# Patient Record
Sex: Female | Born: 1985 | Race: White | Hispanic: No | Marital: Single | Smoking: Current every day smoker
Health system: Southern US, Community
[De-identification: ages and names within clinical notes are randomized; demographics above are authoritative.]

## PROBLEM LIST (undated history)

## (undated) DIAGNOSIS — F411 Generalized anxiety disorder: Secondary | ICD-10-CM

## (undated) DIAGNOSIS — F3289 Other specified depressive episodes: Secondary | ICD-10-CM

## (undated) DIAGNOSIS — A630 Anogenital (venereal) warts: Secondary | ICD-10-CM

## (undated) DIAGNOSIS — K219 Gastro-esophageal reflux disease without esophagitis: Secondary | ICD-10-CM

## (undated) DIAGNOSIS — F329 Major depressive disorder, single episode, unspecified: Secondary | ICD-10-CM

## (undated) DIAGNOSIS — H15001 Unspecified scleritis, right eye: Secondary | ICD-10-CM

## (undated) HISTORY — DX: Major depressive disorder, single episode, unspecified: F32.9

## (undated) HISTORY — DX: Other specified depressive episodes: F32.89

## (undated) HISTORY — DX: Generalized anxiety disorder: F41.1

## (undated) HISTORY — PX: NO PAST SURGERIES: SHX2092

## (undated) HISTORY — DX: Anogenital (venereal) warts: A63.0

## (undated) HISTORY — DX: Unspecified scleritis, right eye: H15.001

## (undated) HISTORY — DX: Gastro-esophageal reflux disease without esophagitis: K21.9

---
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
## 2003-05-13 ENCOUNTER — Other Ambulatory Visit: Admission: RE | Admit: 2003-05-13 | Discharge: 2003-05-13 | Payer: Self-pay | Admitting: Internal Medicine

## 2003-12-24 ENCOUNTER — Other Ambulatory Visit: Admission: RE | Admit: 2003-12-24 | Discharge: 2003-12-24 | Payer: Self-pay | Admitting: Obstetrics and Gynecology

## 2004-05-02 ENCOUNTER — Other Ambulatory Visit: Admission: RE | Admit: 2004-05-02 | Discharge: 2004-05-02 | Payer: Self-pay | Admitting: Obstetrics and Gynecology

## 2004-06-09 ENCOUNTER — Encounter: Admission: RE | Admit: 2004-06-09 | Discharge: 2004-09-07 | Payer: Self-pay | Admitting: Internal Medicine

## 2004-08-02 ENCOUNTER — Ambulatory Visit: Payer: Self-pay | Admitting: Licensed Clinical Social Worker

## 2004-08-12 ENCOUNTER — Ambulatory Visit: Payer: Self-pay | Admitting: Internal Medicine

## 2004-08-16 ENCOUNTER — Ambulatory Visit: Payer: Self-pay | Admitting: Licensed Clinical Social Worker

## 2004-08-18 ENCOUNTER — Ambulatory Visit: Payer: Self-pay | Admitting: Licensed Clinical Social Worker

## 2004-09-02 ENCOUNTER — Ambulatory Visit: Payer: Self-pay | Admitting: Internal Medicine

## 2004-09-08 ENCOUNTER — Ambulatory Visit: Payer: Self-pay | Admitting: Internal Medicine

## 2004-11-04 ENCOUNTER — Other Ambulatory Visit: Admission: RE | Admit: 2004-11-04 | Discharge: 2004-11-04 | Payer: Self-pay | Admitting: Obstetrics and Gynecology

## 2005-01-25 ENCOUNTER — Ambulatory Visit: Payer: Self-pay | Admitting: Internal Medicine

## 2005-02-09 ENCOUNTER — Ambulatory Visit: Payer: Self-pay | Admitting: Internal Medicine

## 2005-02-10 ENCOUNTER — Encounter: Payer: Self-pay | Admitting: Internal Medicine

## 2005-02-10 LAB — CONVERTED CEMR LAB
ALT: 25 units/L
Albumin: 3.8 g/dL
Alkaline Phosphatase: 60 units/L
BUN: 12 mg/dL
Basophils Relative: 0 %
CO2: 25 meq/L
Creatinine, Ser: 0.9 mg/dL
Eosinophils Relative: 0.7 %
HCT: 38.1 %
HDL: 54 mg/dL
Monocytes Relative: 6.8 %
RBC: 4.11 M/uL
Total Bilirubin: 0.7 mg/dL
Triglyceride fasting, serum: 66 mg/dL
WBC: 9.4 10*3/uL

## 2005-02-16 ENCOUNTER — Ambulatory Visit: Payer: Self-pay | Admitting: Internal Medicine

## 2005-02-17 ENCOUNTER — Emergency Department (HOSPITAL_COMMUNITY): Admission: EM | Admit: 2005-02-17 | Discharge: 2005-02-17 | Payer: Self-pay | Admitting: Emergency Medicine

## 2005-02-20 ENCOUNTER — Ambulatory Visit: Payer: Self-pay | Admitting: Cardiology

## 2005-03-06 ENCOUNTER — Ambulatory Visit: Payer: Self-pay | Admitting: Internal Medicine

## 2005-03-21 ENCOUNTER — Ambulatory Visit: Payer: Self-pay | Admitting: Internal Medicine

## 2005-03-28 ENCOUNTER — Ambulatory Visit: Payer: Self-pay | Admitting: Internal Medicine

## 2005-04-28 ENCOUNTER — Other Ambulatory Visit: Admission: RE | Admit: 2005-04-28 | Discharge: 2005-04-28 | Payer: Self-pay | Admitting: Obstetrics and Gynecology

## 2006-01-29 ENCOUNTER — Other Ambulatory Visit: Admission: RE | Admit: 2006-01-29 | Discharge: 2006-01-29 | Payer: Self-pay | Admitting: Obstetrics & Gynecology

## 2006-03-20 ENCOUNTER — Ambulatory Visit: Payer: Self-pay | Admitting: Internal Medicine

## 2006-06-01 ENCOUNTER — Ambulatory Visit: Payer: Self-pay | Admitting: Internal Medicine

## 2006-11-19 ENCOUNTER — Other Ambulatory Visit: Admission: RE | Admit: 2006-11-19 | Discharge: 2006-11-19 | Payer: Self-pay | Admitting: Obstetrics & Gynecology

## 2007-06-28 ENCOUNTER — Other Ambulatory Visit: Admission: RE | Admit: 2007-06-28 | Discharge: 2007-06-28 | Payer: Self-pay | Admitting: Obstetrics & Gynecology

## 2007-08-06 ENCOUNTER — Other Ambulatory Visit: Admission: RE | Admit: 2007-08-06 | Discharge: 2007-08-06 | Payer: Self-pay | Admitting: Obstetrics and Gynecology

## 2007-11-22 ENCOUNTER — Other Ambulatory Visit: Admission: RE | Admit: 2007-11-22 | Discharge: 2007-11-22 | Payer: Self-pay | Admitting: Obstetrics and Gynecology

## 2009-11-02 LAB — CONVERTED CEMR LAB

## 2010-07-07 ENCOUNTER — Encounter: Payer: Self-pay | Admitting: Internal Medicine

## 2010-07-07 DIAGNOSIS — Z8679 Personal history of other diseases of the circulatory system: Secondary | ICD-10-CM | POA: Insufficient documentation

## 2010-07-07 DIAGNOSIS — L708 Other acne: Secondary | ICD-10-CM

## 2010-07-07 DIAGNOSIS — K219 Gastro-esophageal reflux disease without esophagitis: Secondary | ICD-10-CM

## 2010-07-07 DIAGNOSIS — Z9189 Other specified personal risk factors, not elsewhere classified: Secondary | ICD-10-CM | POA: Insufficient documentation

## 2010-07-07 DIAGNOSIS — F329 Major depressive disorder, single episode, unspecified: Secondary | ICD-10-CM

## 2010-07-07 DIAGNOSIS — F17200 Nicotine dependence, unspecified, uncomplicated: Secondary | ICD-10-CM | POA: Insufficient documentation

## 2010-07-07 DIAGNOSIS — F411 Generalized anxiety disorder: Secondary | ICD-10-CM | POA: Insufficient documentation

## 2010-07-07 DIAGNOSIS — Z8619 Personal history of other infectious and parasitic diseases: Secondary | ICD-10-CM | POA: Insufficient documentation

## 2010-07-08 ENCOUNTER — Ambulatory Visit: Payer: Self-pay | Admitting: Internal Medicine

## 2010-07-08 DIAGNOSIS — A630 Anogenital (venereal) warts: Secondary | ICD-10-CM | POA: Insufficient documentation

## 2010-07-08 DIAGNOSIS — R220 Localized swelling, mass and lump, head: Secondary | ICD-10-CM | POA: Insufficient documentation

## 2010-07-08 DIAGNOSIS — Z87891 Personal history of nicotine dependence: Secondary | ICD-10-CM

## 2010-07-08 DIAGNOSIS — R221 Localized swelling, mass and lump, neck: Secondary | ICD-10-CM | POA: Insufficient documentation

## 2010-07-08 DIAGNOSIS — Z87448 Personal history of other diseases of urinary system: Secondary | ICD-10-CM | POA: Insufficient documentation

## 2010-07-08 LAB — CONVERTED CEMR LAB
ALT: 22 units/L (ref 0–35)
AST: 22 units/L (ref 0–37)
Albumin: 4.1 g/dL (ref 3.5–5.2)
BUN: 12 mg/dL (ref 6–23)
Basophils Relative: 0.7 % (ref 0.0–3.0)
Bilirubin Urine: NEGATIVE
CO2: 27 meq/L (ref 19–32)
Chloride: 106 meq/L (ref 96–112)
Cholesterol: 161 mg/dL (ref 0–200)
Creatinine, Ser: 0.8 mg/dL (ref 0.4–1.2)
Eosinophils Absolute: 0.5 10*3/uL (ref 0.0–0.7)
Glucose, Bld: 99 mg/dL (ref 70–99)
HCT: 39.2 % (ref 36.0–46.0)
Hemoglobin, Urine: NEGATIVE
Hemoglobin: 13.5 g/dL (ref 12.0–15.0)
Ketones, ur: NEGATIVE mg/dL
LDL Cholesterol: 84 mg/dL (ref 0–99)
Leukocytes, UA: NEGATIVE
Lymphocytes Relative: 39.2 % (ref 12.0–46.0)
Lymphs Abs: 2.3 10*3/uL (ref 0.7–4.0)
MCHC: 34.4 g/dL (ref 30.0–36.0)
Neutro Abs: 2.6 10*3/uL (ref 1.4–7.7)
Nitrite: NEGATIVE
Potassium: 4.4 meq/L (ref 3.5–5.1)
RBC: 4 M/uL (ref 3.87–5.11)
RDW: 12.1 % (ref 11.5–14.6)
TSH: 0.85 microintl units/mL (ref 0.35–5.50)
Total Bilirubin: 0.5 mg/dL (ref 0.3–1.2)
Total Protein: 7.2 g/dL (ref 6.0–8.3)
Triglycerides: 85 mg/dL (ref 0.0–149.0)
Urobilinogen, UA: 0.2 (ref 0.0–1.0)
pH: 6 (ref 5.0–8.0)

## 2010-07-18 ENCOUNTER — Encounter: Admission: RE | Admit: 2010-07-18 | Discharge: 2010-07-18 | Payer: Self-pay | Admitting: Internal Medicine

## 2010-11-01 NOTE — Assessment & Plan Note (Signed)
Summary: NEW [employer] PT--#--FORMER [facility] PT-PKG--STC   Vital Signs:  Patient profile:   25 year old female Height:      62.5 inches (158.75 cm) Weight:      102.8 pounds (46.73 kg) BMI:     18.57 O2 Sat:      95 % on Room air Temp:     97.8 degrees F (36.56 degrees C) oral Pulse rate:   68 / minute BP sitting:   104 / 62  (left arm) Cuff size:   regular  Vitals Entered By: Orlan Leavens RMA (July 08, 2010 8:11 AM)  O2 Flow:  Room air CC: New patient Is Patient Diabetic? No Pain Assessment Patient in pain? no        Primary Care Provider:  Newt Lukes MD  CC:  New patient.  History of Present Illness: new pt to me and our practice, here to est care patient is here today for annual physical. Patient feels well today.  concerned about soreness in anterior neck onset 2 months ago - feels full and swollen in "thyroid" area assoc with mild dysphagia - solids and liquids but no regurg or choking, no pain no weight change, no fever - no swelling axillary or groin region-  Preventive Screening-Counseling & Management  Alcohol-Tobacco     Alcohol drinks/day: 0     Alcohol Counseling: not indicated; patient does not drink     Smoking Status: quit     Tobacco Counseling: not to resume use of tobacco products  Caffeine-Diet-Exercise     Exercise Counseling: not indicated; exercise is adequate     Depression Counseling: not indicated; screening negative for depression  Safety-Violence-Falls     Seat Belt Counseling: not indicated; patient wears seat belts     Helmet Counseling: not indicated; patient wears helmet when riding bicycle/motocycle     Firearm Counseling: not applicable     Violence Counseling: not indicated; no violence risk noted     Fall Risk Counseling: not indicated; no significant falls noted  Clinical Review Panels:  Prevention   Last Pap Smear:  Interpretation Result:Negative for intraepithelial Lesion or Malignancy.     (11/02/2009)  Immunizations   Last Tetanus Booster:  Historical (10/03/2007)  Lipid Management   Cholesterol:  166 (02/10/2005)   LDL (bad choesterol):  99 (02/10/2005)   HDL (good cholesterol):  54 (02/10/2005)   Triglycerides:  66 (02/10/2005)  CBC   WBC:  9.4 (02/10/2005)   RBC:  4.11 (02/10/2005)   Hgb:  13.5 (02/10/2005)   Hct:  38.1 (02/10/2005)   Platelets:  211 (02/10/2005)   MCV  92.7 (02/10/2005)   RDW  11.5 (02/10/2005)   PMN:  68.9 (02/10/2005)   Monos:  6.8 (02/10/2005)   Eosinophils:  0.7 (02/10/2005)   Basophil:  0 (02/10/2005)  Complete Metabolic Panel   Glucose:  89 (02/10/2005)   Sodium:  137 (02/10/2005)   Potassium:  3.7 (02/10/2005)   Chloride:  100 (02/10/2005)   CO2:  25 (02/10/2005)   BUN:  12 (02/10/2005)   Creatinine:  0.9 (02/10/2005)   Albumin:  3.8 (02/10/2005)   Total Protein:  7.1 (02/10/2005)   Calcium:  9.4 (02/10/2005)   Total Bili:  0.7 (02/10/2005)   Alk Phos:  60 (02/10/2005)   SGPT (ALT):  25 (02/10/2005)   SGOT (AST):  28 (02/10/2005)   Current Medications (verified): 1)  Junel Fe 1/20 1-20 Mg-Mcg Tabs (Norethin Ace-Eth Estrad-Fe) .... 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 I have reviewed all other systems and they were negative.    Physical Exam  General:  thin, alert, well-developed, well-nourished, and cooperative to examination.    Head:  Normocephalic and atraumatic without obvious abnormalities. No apparent alopecia or balding. Eyes:  vision grossly intact; pupils equal, round and reactive to light.  conjunctiva and lids normal.    Ears:  normal pinnae bilaterally, without erythema, swelling, or tenderness to palpation. TMs clear, without effusion, or cerumen impaction. Hearing grossly normal bilaterally  Mouth:  teeth and gums in good repair; mucous membranes moist, without lesions or ulcers. oropharynx clear without exudate, no erythema.  Neck:  supple, full ROM, no masses, no thyromegaly; no thyroid nodules or tenderness. no JVD or carotid bruits.  min fullnesss base of right neck (normal LN suspected) Lungs:  normal respiratory effort, no intercostal retractions or use of accessory muscles; normal breath sounds bilaterally - no crackles and no wheezes.    Heart:  normal rate, regular rhythm, no murmur, and no rub. BLE without edema. normal DP pulses and normal cap refill in all 4 extremities    Abdomen:  soft, non-tender, normal bowel sounds, no distention; no masses and no appreciable hepatomegaly or splenomegaly.   Genitalia:  defer gyn Msk:  No deformity or scoliosis noted of thoracic or lumbar spine.   Neurologic:  alert & oriented X3 and cranial nerves II-XII symetrically intact.  strength normal in all extremities, sensation intact to light touch, and gait normal. speech fluent without dysarthria or aphasia; follows commands with good comprehension.  Skin:  no rashes, vesicles, ulcers, or erythema. No nodules or irregularity to palpation.  Psych:  Oriented X3, memory intact for recent and remote, normally interactive, good eye contact, not anxious appearing, not depressed appearing, and not agitated.      Impression & Recommendations:  Problem # 1:  PREVENTIVE HEALTH CARE (ICD-V70.0) Patient has been  counseled on age-appropriate routine health concerns for screening and prevention. These are reviewed and up-to-date. Immunizations are up-to-date or declined. Labs ordered and will be reviewed.  Orders: TLB-Lipid Panel (80061-LIPID) TLB-BMP (Basic Metabolic Panel-BMET) (80048-METABOL) TLB-CBC Platelet - w/Differential (85025-CBCD) TLB-Hepatic/Liver Function Pnl (80076-HEPATIC) TLB-TSH (Thyroid Stimulating Hormone) (84443-TSH) TLB-Udip w/ Micro (81001-URINE)  Problem # 2:  SWELLING MASS OR LUMP IN HEAD AND NECK (ICD-784.2) suspect benign but pt concerned due to mom hx thyroid cysts - as assoc with symptoms of dysphagia, will check neck US r/o mass cont OTC H2B or PPI as needed  Orders: Radiology Referral (Radiology)  Problem # 3:  DYSPHAGIA UNSPECIFIED (ZOX-096.04)  Orders: Radiology Referral (Radiology)  Complete Medication List: 1)  Junel Fe 1/20 1-20 Mg-mcg Tabs (Norethin ace-eth estrad-fe) .... Take 1 by mouth once daily  Patient Instructions: 1)  it was good to see you today. 2)  exama dn vitals look good today - 3)  test(s) ordered today - your  results will be posted on the phone tree for review in 48-72 hours from the time of test completion; call [phone] and enter your 9 digit MRN (listed above on this page, just below your name); if any changes need to be made or there are abnormal results, you will be contacted directly. 4)  we'll make referral for neck ultrasound. Our office will contact you regarding this appointment once made. Then you will be contacted about results once reviewed - further eval or treatment will be determined at that time as needed  5)  Please schedule a follow-up appointment as needed.   Immunization History:  Tetanus/Td Immunization History:    Tetanus/Td:  historical (10/03/2007)    Pap Smear  Procedure date:  11/02/2009  Findings:      Interpretation Result:Negative for intraepithelial Lesion or Malignancy.

## 2011-02-17 IMAGING — US US SOFT TISSUE HEAD/NECK
1 series · 14 of 25 positions shown · non-contrast
Comparison: None.

CLINICAL DATA: The patient notes a slight swelling within the
submental region.  Also, she states she has a history of thyroid
cysts in her mother.

ULTRASOUND OF HEAD/NECK SOFT TISSUES
TECHNIQUE: Ultrasound examination of the head and neck soft
tissues was performed in the area of clinical concern.

[Series 1: us soft tissue head/neck · 0.08mm/px · 14 of 53 slices shown]
[im 1/53]
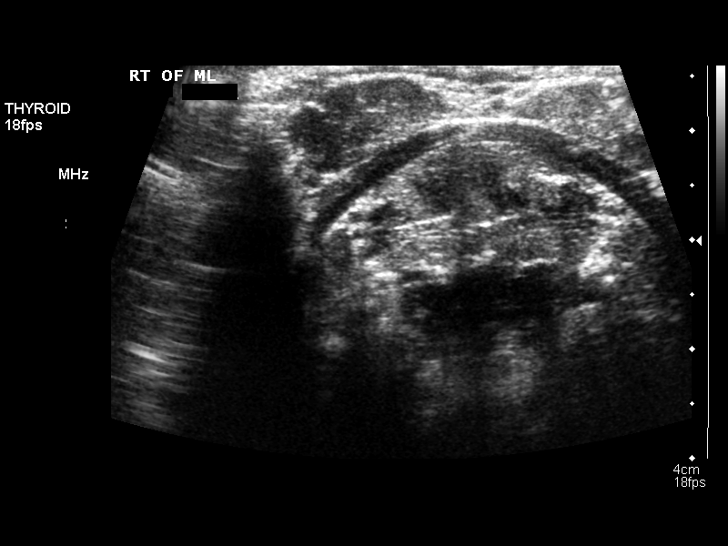
[im 5/53]
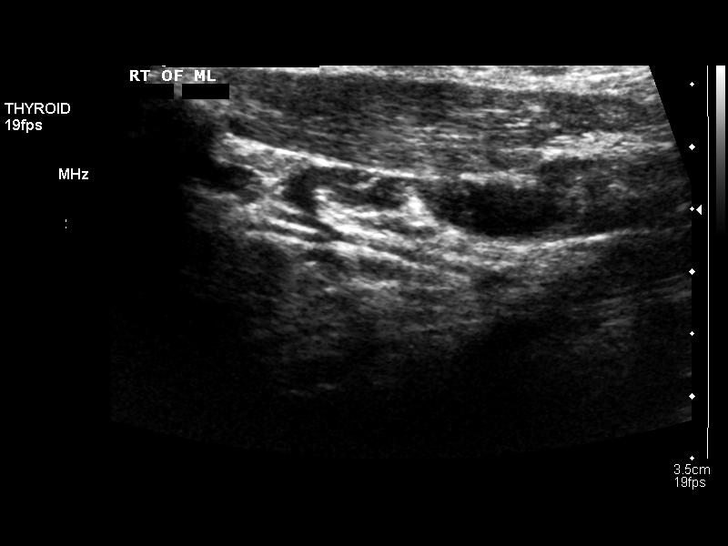
[im 9/53]
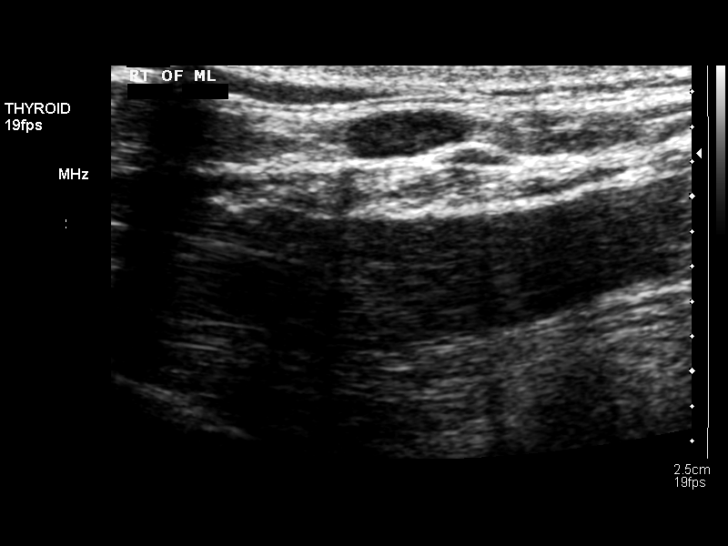
[im 14/53]
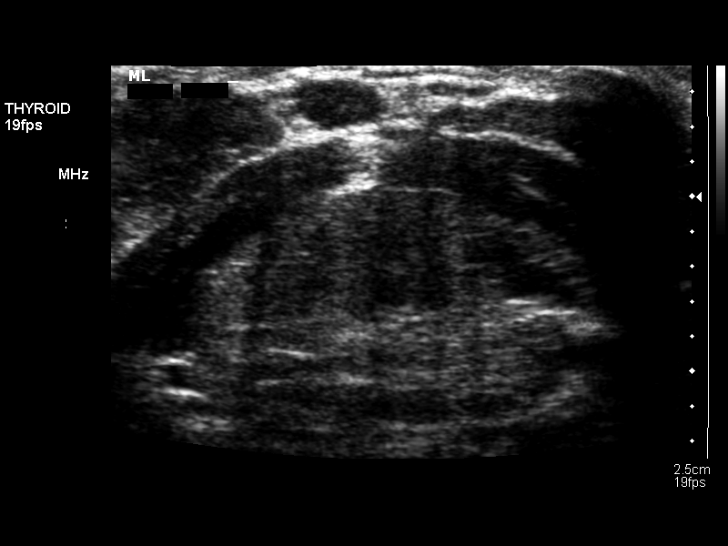
[im 18/53]
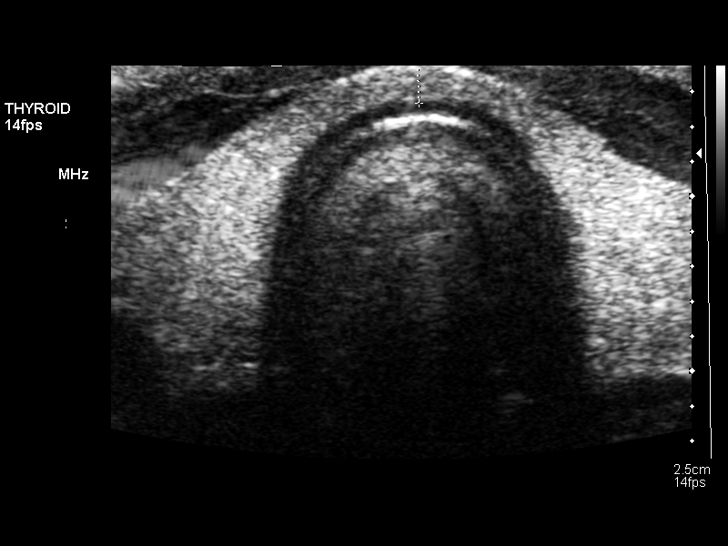
[im 20/53]
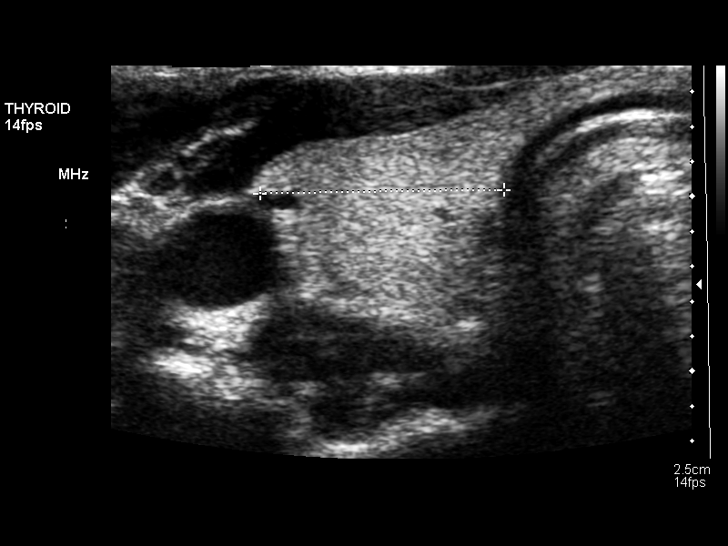
[im 24/53]
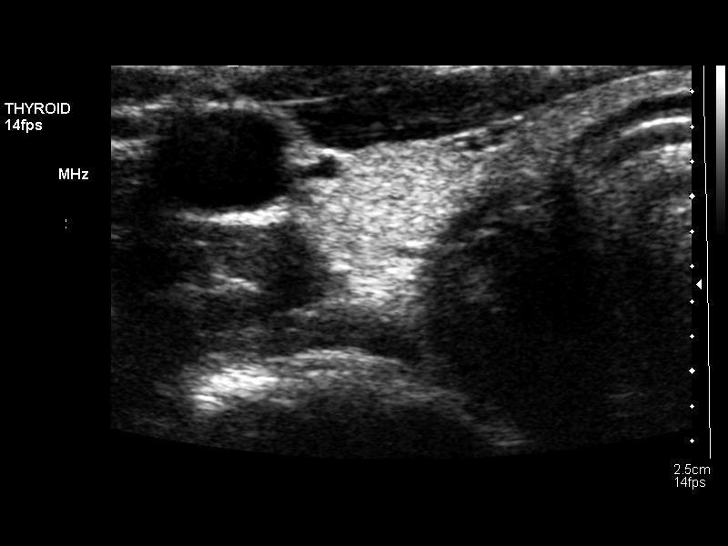
[im 29/53]
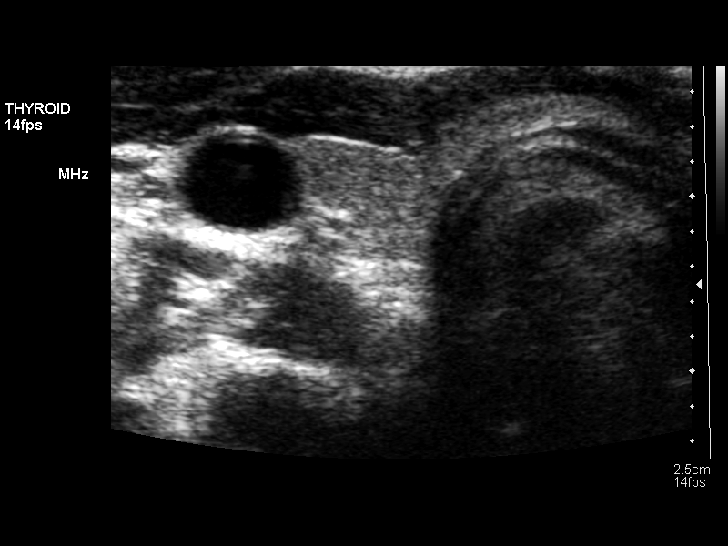
[im 33/53]
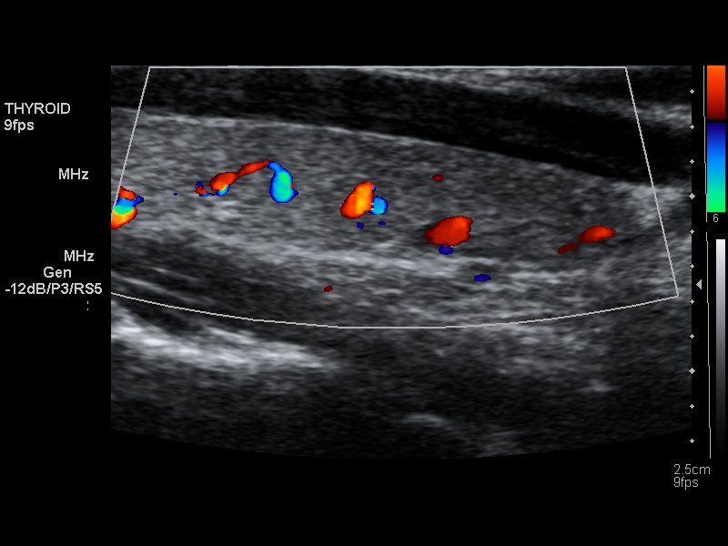
[im 35/53]
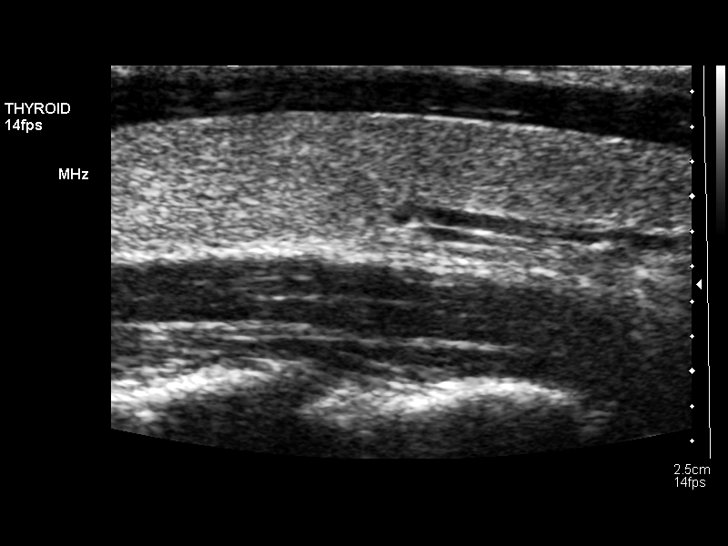
[im 40/53]
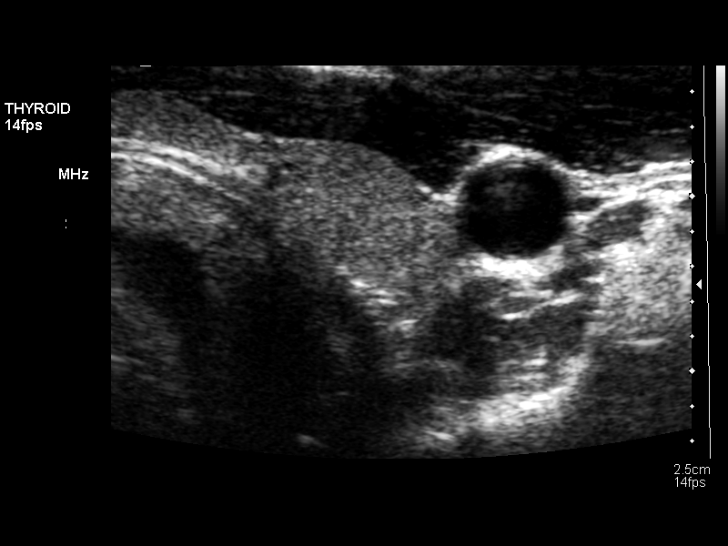
[im 44/53]
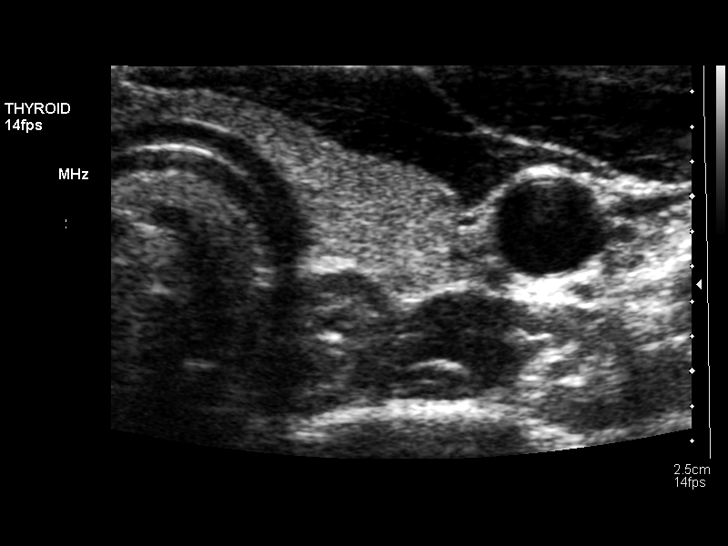
[im 48/53]
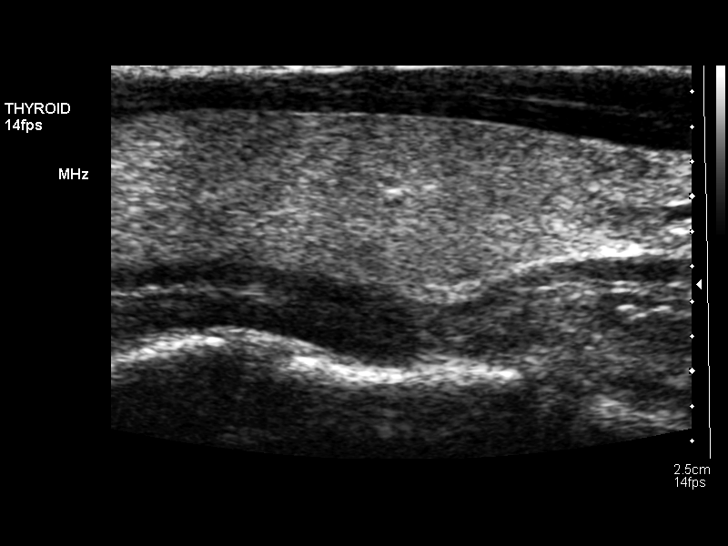
[im 53/53]
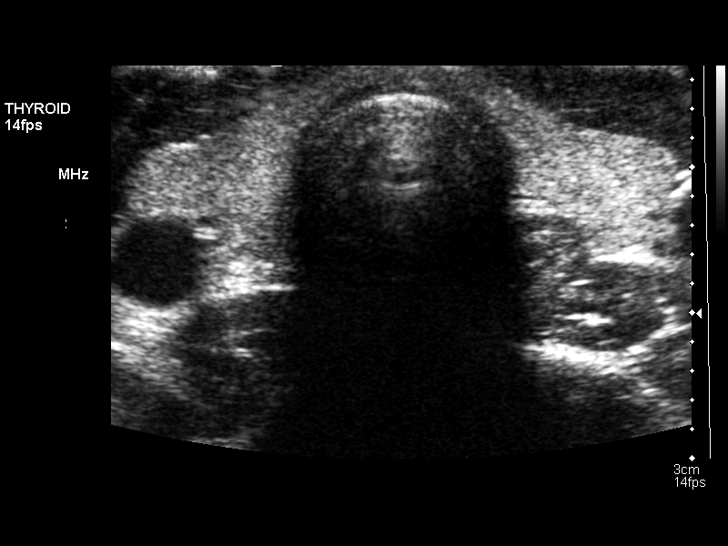

[14 of 25 positions shown; findings below may reference images not displayed]

FINDINGS: There is a 7 mm normal-appearing submental lymph node
present in the area the patient has slight swelling.  The there is
no evidence for a mass in this region.

The thyroid gland is normal in size and contour with the right lobe
of the thyroid measuring 5.0 x 1.0 x 1.4 cm in size.  The left lobe
measures 4.4 x 1.2 x 1.4 cm in size.  There are no focal
abnormalities.  The thyroid echotexture is homogeneous.
IMPRESSION: Small (7 mm) normal appearing lymph node within the submental
region.  Normal appearing thyroid gland.

## 2011-03-21 ENCOUNTER — Inpatient Hospital Stay (INDEPENDENT_AMBULATORY_CARE_PROVIDER_SITE_OTHER)
Admission: RE | Admit: 2011-03-21 | Discharge: 2011-03-21 | Disposition: A | Discharge status: Home / Self Care | Payer: Self-pay | Source: Ambulatory Visit | Attending: Family Medicine | Admitting: Family Medicine

## 2011-03-21 DIAGNOSIS — H6120 Impacted cerumen, unspecified ear: Secondary | ICD-10-CM

## 2011-06-06 ENCOUNTER — Telehealth: Payer: Self-pay | Admitting: *Deleted

## 2011-06-06 DIAGNOSIS — Z0000 Encounter for general adult medical examination without abnormal findings: Secondary | ICD-10-CM

## 2011-06-06 NOTE — Telephone Encounter (Signed)
Received staff msg pt coming in for CPX needs labs entered in EPIC. Entered in EPIC...06/06/11@12 :13pm/LMB

## 2011-06-08 ENCOUNTER — Other Ambulatory Visit (INDEPENDENT_AMBULATORY_CARE_PROVIDER_SITE_OTHER): Payer: 59

## 2011-06-08 ENCOUNTER — Ambulatory Visit (INDEPENDENT_AMBULATORY_CARE_PROVIDER_SITE_OTHER): Payer: 59 | Admitting: Internal Medicine

## 2011-06-08 ENCOUNTER — Encounter: Payer: Self-pay | Admitting: Internal Medicine

## 2011-06-08 VITALS — BP 110/70 | HR 89 | Temp 98.2°F | Ht 62.0 in | Wt 97.0 lb

## 2011-06-08 DIAGNOSIS — Z0000 Encounter for general adult medical examination without abnormal findings: Secondary | ICD-10-CM

## 2011-06-08 DIAGNOSIS — G4700 Insomnia, unspecified: Secondary | ICD-10-CM | POA: Insufficient documentation

## 2011-06-08 LAB — CBC WITH DIFFERENTIAL/PLATELET
Basophils Absolute: 0 10*3/uL (ref 0.0–0.1)
HCT: 38.5 % (ref 36.0–46.0)
Hemoglobin: 13.1 g/dL (ref 12.0–15.0)
Lymphs Abs: 1.9 10*3/uL (ref 0.7–4.0)
MCHC: 34 g/dL (ref 30.0–36.0)
MCV: 98.2 fl (ref 78.0–100.0)
Monocytes Absolute: 0.3 10*3/uL (ref 0.1–1.0)
Neutro Abs: 2.3 10*3/uL (ref 1.4–7.7)
Platelets: 195 10*3/uL (ref 150.0–400.0)
RDW: 12.4 % (ref 11.5–14.6)

## 2011-06-08 LAB — LIPID PANEL
LDL Cholesterol: 82 mg/dL (ref 0–99)
Total CHOL/HDL Ratio: 3
Triglycerides: 80 mg/dL (ref 0.0–149.0)
VLDL: 16 mg/dL (ref 0.0–40.0)

## 2011-06-08 LAB — BASIC METABOLIC PANEL
BUN: 14 mg/dL (ref 6–23)
CO2: 24 mEq/L (ref 19–32)
Chloride: 101 mEq/L (ref 96–112)
Glucose, Bld: 132 mg/dL — ABNORMAL HIGH (ref 70–99)
Potassium: 3.9 mEq/L (ref 3.5–5.1)

## 2011-06-08 LAB — URINALYSIS, ROUTINE W REFLEX MICROSCOPIC
Bilirubin Urine: NEGATIVE
Hgb urine dipstick: NEGATIVE
Ketones, ur: NEGATIVE
Urine Glucose: NEGATIVE
Urobilinogen, UA: 0.2 (ref 0.0–1.0)

## 2011-06-08 LAB — HEPATIC FUNCTION PANEL
Albumin: 4.2 g/dL (ref 3.5–5.2)
Total Bilirubin: 0.6 mg/dL (ref 0.3–1.2)

## 2011-06-08 MED ORDER — ZOLPIDEM TARTRATE 5 MG PO TABS: 5.0000 mg | ORAL_TABLET | Freq: Every evening | ORAL | Status: DC | PRN

## 2011-06-08 NOTE — Patient Instructions (Signed)
It was good to see you today. Exam and vitals look good and your Health Maintenance reviewed - all up to date! Form signed as requested Test(s) done for physical today. Your results will be called to you after review (48-72hours after test completion). If any changes need to be made, you will be notified at that time. Try generic Ambien for sleep as needed - Your prescription(s) have been submitted to your pharmacy. Please take as directed and contact our office if you believe you are having problem(s) with the medication(s). Please schedule followup annually for physical and labs, call sooner if problems.

## 2011-06-08 NOTE — Progress Notes (Signed)
Subjective:    Patient ID: Kaitlyn Sutton, female    DOB: 1986/09/02, 25 y.o.   MRN: [id number]  HPI patient is here today for annual physical. Patient feels well today  occassional insomnia - uses benadryl prn with some relief - denies depression or anxiety symptoms   Past Medical History  Diagnosis Date  . ANXIETY   . DEPRESSION   . GERD   . VENEREAL WART    Family History  Problem Relation Age of Onset  . Alcohol abuse Mother   . Prostate cancer Maternal Uncle   . Heart attack Paternal Grandfather   . Alcohol abuse Other     grandparents  . Hypertension Other     grandparents  . Hyperlipidemia Other     grandparents   History  Substance Use Topics  . Smoking status: Former Games developer  . Smokeless tobacco: Not on file   Comment: Single- lives with parents. Grad [employer] 09/2009 BA  . Alcohol Use: No    Review of Systems Constitutional: Negative for fever. Positive for chronic insomnia. Respiratory: Negative for cough and shortness of breath.   Cardiovascular: Negative for chest pain.  Gastrointestinal: Negative for abdominal pain.  Musculoskeletal: Negative for gait problem.  Skin: Negative for rash.  Neurological: Negative for dizziness.  No other specific complaints in a complete review of systems (except as listed in HPI above).     Objective:   Physical Exam BP 110/70  Pulse 89  Temp(Src) 98.2 F (36.8 C) (Oral)  Ht 5\' 2"  (1.575 m)  Wt 97 lb (43.999 kg)  BMI 17.74 kg/m2  SpO2 97% Wt Readings from Last 3 Encounters:  06/08/11 97 lb (43.999 kg)  07/08/10 102 lb 12.8 oz (46.63 kg)  06/01/06 100 lb (45.36 kg)   Constitutional: She is thin. She appears well-developed. No distress.  HENT: Head: Normocephalic and atraumatic. Ears: B TMs ok, no erythema or effusion; Nose: Nose normal.  Mouth/Throat: Oropharynx is clear and moist. No oropharyngeal exudate.  Eyes: Conjunctivae and EOM are normal. Pupils are equal, round, and reactive to light. No scleral icterus.    Neck: Normal range of motion. Neck supple. No JVD present. No thyromegaly present.  Cardiovascular: Normal rate, regular rhythm and normal heart sounds.  No murmur heard. No BLE edema. Pulmonary/Chest: Effort normal and breath sounds normal. No respiratory distress. She has no wheezes.  Abdominal: Soft. Bowel sounds are normal. She exhibits no distension. There is no tenderness. no masses GU/pelvic - defer to gyn Musculoskeletal: Normal range of motion, no joint effusions. No gross deformities Neurological: She is alert and oriented to person, place, and time. No cranial nerve deficit. Coordination normal.  Skin: Skin is warm and dry. No rash noted. No erythema.  Psychiatric: She has a normal mood and affect. Her behavior is normal. Judgment and thought content normal.   Lab Results  Component Value Date   WBC 5.9 07/08/2010   HGB 13.5 07/08/2010   HCT 39.2 07/08/2010   PLT 179.0 07/08/2010   CHOL 161 07/08/2010   TRIG 85.0 07/08/2010   HDL 60.30 07/08/2010   ALT 22 07/08/2010   AST 22 07/08/2010   NA 140 07/08/2010   K 4.4 07/08/2010   CL 106 07/08/2010   CREATININE 0.8 07/08/2010   BUN 12 07/08/2010   CO2 27 07/08/2010   TSH 0.85 07/08/2010      Assessment & Plan:  CPX - v70.0 - Patient has been counseled on age-appropriate routine health concerns for screening and prevention.  These are reviewed and up-to-date. Immunizations are up-to-date or declined. Labs done and will be reviewed.  Insomnia - natural relaxation, sleep hygiene reviewed - ok for prn ambien - rx done today

## 2012-01-31 DIAGNOSIS — H15001 Unspecified scleritis, right eye: Secondary | ICD-10-CM

## 2012-01-31 HISTORY — DX: Unspecified scleritis, right eye: H15.001

## 2012-02-14 ENCOUNTER — Encounter: Payer: Self-pay | Admitting: Internal Medicine

## 2012-02-14 ENCOUNTER — Ambulatory Visit (INDEPENDENT_AMBULATORY_CARE_PROVIDER_SITE_OTHER): Payer: 59 | Admitting: Internal Medicine

## 2012-02-14 VITALS — BP 92/62 | HR 99 | Temp 98.4°F | Ht 62.0 in | Wt 101.0 lb

## 2012-02-14 DIAGNOSIS — H109 Unspecified conjunctivitis: Secondary | ICD-10-CM

## 2012-02-14 MED ORDER — TOBRAMYCIN 0.3 % OP SOLN: 1.0000 [drp] | Freq: Four times a day (QID) | OPHTHALMIC | Status: AC

## 2012-02-14 NOTE — Patient Instructions (Signed)
Take all new medications as prescribed Continue all other medications as before You should also consider allegra OTC - 1 per day for the allergies

## 2012-02-17 ENCOUNTER — Encounter: Payer: Self-pay | Admitting: Internal Medicine

## 2012-02-17 NOTE — Progress Notes (Signed)
  Subjective:    Patient ID: Kaitlyn Sutton, female    DOB: 06/13/1986, 26 y.o.   MRN: [id number]  HPI  Here with 2wks gradually worsening right eye red, discomfort, d/c and matting in the am, with low grade temp, without vision change, left eye involvement, trauma, irriation , ear or sinus symptoms , ST, or cough.  Pt denies chest pain, increased sob or doe, wheezing, orthopnea, PND, increased LE swelling, palpitations, dizziness or syncope.  Has had some mild nasal alelrgy congestion in the pasts 2 wks as well  Past Medical History  Diagnosis Date  . ANXIETY   . DEPRESSION   . GERD   . VENEREAL WART    Past Surgical History  Procedure Date  . No past surgeries     reports that she has quit smoking. She does not have any smokeless tobacco history on file. She reports that she does not drink alcohol or use illicit drugs. family history includes Alcohol abuse in her mother and other; Heart attack in her paternal grandfather; Hyperlipidemia in her other; Hypertension in her other; and Prostate cancer in her maternal uncle. No Known Allergies Current Outpatient Prescriptions on File Prior to Visit  Medication Sig Dispense Refill  . norethindrone-ethinyl estradiol (JUNEL FE,GILDESS FE,LOESTRIN FE) 1-20 MG-MCG tablet Take 1 tablet by mouth daily.        Marland Kitchen zolpidem (AMBIEN) 5 MG tablet Take 1 tablet (5 mg total) by mouth at bedtime as needed for sleep.  30 tablet  1   Review of Systems All otherwise neg per pt     Objective:   Physical Exam BP 92/62  Pulse 99  Temp(Src) 98.4 F (36.9 C) (Oral)  Ht 5\' 2"  (1.575 m)  Wt 101 lb (45.813 kg)  BMI 18.47 kg/m2  SpO2 97% Physical Exam  VS noted Constitutional: Pt appears well-developed and well-nourished.  HENT: Head: Normocephalic.  Right Ear: External ear normal.  Left Ear: External ear normal.  Bilat tm's mild erythema.  Sinus nontender.  Pharynx mild erythema Eyes: Conjunctivae and EOM are normal except orr right eye conjunctival  erythema and mild d/c, Pupils are equal, round, and reactive to light.  Neck: Normal range of motion. Neck supple.  Cardiovascular: Normal rate and regular rhythm.   Pulmonary/Chest: Effort normal and breath sounds normal.  Psychiatric: Pt behavior is normal. Thought content normal. ? Mild dysphoric    Assessment & Plan:

## 2012-02-17 NOTE — Assessment & Plan Note (Signed)
Mild to mod, for antibx course,  to f/u any worsening symptoms or concerns 

## 2012-02-19 ENCOUNTER — Telehealth: Payer: Self-pay

## 2012-02-19 DIAGNOSIS — H109 Unspecified conjunctivitis: Secondary | ICD-10-CM

## 2012-02-19 NOTE — Telephone Encounter (Signed)
The patient was seen by Dr. Jonny Ruiz on 02/14/12 for an eye problem.  She has gotten no better and would like a referral to an eye MD asap.

## 2012-02-19 NOTE — Telephone Encounter (Signed)
Pt informed of referral

## 2012-02-19 NOTE — Telephone Encounter (Signed)
done

## 2012-04-12 ENCOUNTER — Other Ambulatory Visit: Payer: 59

## 2012-04-12 ENCOUNTER — Ambulatory Visit (INDEPENDENT_AMBULATORY_CARE_PROVIDER_SITE_OTHER): Payer: 59 | Admitting: Internal Medicine

## 2012-04-12 ENCOUNTER — Encounter: Payer: Self-pay | Admitting: Internal Medicine

## 2012-04-12 VITALS — BP 100/68 | HR 77 | Temp 97.6°F | Ht 62.0 in | Wt 104.4 lb

## 2012-04-12 DIAGNOSIS — Z7251 High risk heterosexual behavior: Secondary | ICD-10-CM

## 2012-04-12 NOTE — Progress Notes (Signed)
  Subjective:    Patient ID: Kaitlyn Sutton, female    DOB: 20-Feb-1986, 26 y.o.   MRN: [id number]  HPI here for follow up - Requests STI testing - high risk exposure 2 weeks ago  Past Medical History  Diagnosis Date  . ANXIETY   . DEPRESSION   . GERD   . VENEREAL WART     Review of Systems  Constitutional: Negative for fever.  GU: no dysuria, no vaginal discontinued or itching Skin: no sores/wounds/new lesions    Objective:   Physical Exam  BP 100/68  Pulse 77  Temp 97.6 F (36.4 C) (Oral)  Ht 5\' 2"  (1.575 m)  Wt 104 lb 6.4 oz (47.356 kg)  BMI 19.10 kg/m2  SpO2 99% Wt Readings from Last 3 Encounters:  04/12/12 104 lb 6.4 oz (47.356 kg)  02/14/12 101 lb (45.813 kg)  06/08/11 97 lb (43.999 kg)   Constitutional: She is thin. She appears well-developed. No distress.  GU - deferred  Psychiatric: She has a normal mood and affect. Her behavior is normal. Judgment and thought content normal.   Lab Results  Component Value Date   WBC 4.9 06/08/2011   HGB 13.1 06/08/2011   HCT 38.5 06/08/2011   PLT 195.0 06/08/2011   CHOL 161 06/08/2011   TRIG 80.0 06/08/2011   HDL 63.40 06/08/2011   ALT 20 06/08/2011   AST 24 06/08/2011   NA 138 06/08/2011   K 3.9 06/08/2011   CL 101 06/08/2011   CREATININE 0.8 06/08/2011   BUN 14 06/08/2011   CO2 24 06/08/2011   TSH 1.04 06/08/2011      Assessment & Plan:   High risk sexual exposure 2 weeks ago - asymptomatic Hx prior GC/Chalm ad abn PAP - but last PAP 11/2011 normal Check screening STI now  - recheck HIV in 6 weeks if negative No UPT as on BCP

## 2012-04-12 NOTE — Patient Instructions (Signed)
It was good to see you today. Test(s) ordered today. Your results will be called to you after review (48-72hours after test completion). If any changes need to be made, you will be notified at that time.  

## 2012-04-13 LAB — HIV ANTIBODY (ROUTINE TESTING W REFLEX): HIV: NONREACTIVE

## 2012-04-13 LAB — GC PROBE AMPLIFICATION, URINE: GC Probe Amp, Urine: NEGATIVE

## 2012-04-13 LAB — CHLAMYDIA PROBE AMPLIFICATION, URINE: Chlamydia, Swab/Urine, PCR: NEGATIVE

## 2012-05-31 ENCOUNTER — Other Ambulatory Visit: Payer: Self-pay

## 2012-05-31 DIAGNOSIS — Z7251 High risk heterosexual behavior: Secondary | ICD-10-CM

## 2012-06-07 ENCOUNTER — Other Ambulatory Visit: Payer: 59

## 2012-06-07 DIAGNOSIS — Z7251 High risk heterosexual behavior: Secondary | ICD-10-CM

## 2012-06-07 LAB — HIV ANTIBODY (ROUTINE TESTING W REFLEX): HIV: NONREACTIVE

## 2012-07-05 ENCOUNTER — Encounter: Payer: Self-pay | Admitting: Internal Medicine

## 2012-07-05 ENCOUNTER — Other Ambulatory Visit (INDEPENDENT_AMBULATORY_CARE_PROVIDER_SITE_OTHER): Payer: 59

## 2012-07-05 ENCOUNTER — Ambulatory Visit (INDEPENDENT_AMBULATORY_CARE_PROVIDER_SITE_OTHER): Payer: 59 | Admitting: Internal Medicine

## 2012-07-05 VITALS — BP 110/68 | HR 73 | Temp 98.4°F | Ht 62.0 in | Wt 103.0 lb

## 2012-07-05 DIAGNOSIS — Z0000 Encounter for general adult medical examination without abnormal findings: Secondary | ICD-10-CM

## 2012-07-05 LAB — URINALYSIS, ROUTINE W REFLEX MICROSCOPIC
Bilirubin Urine: NEGATIVE
Ketones, ur: NEGATIVE
Leukocytes, UA: NEGATIVE
Nitrite: NEGATIVE
Urobilinogen, UA: 0.2 (ref 0.0–1.0)

## 2012-07-05 LAB — CBC WITH DIFFERENTIAL/PLATELET
Basophils Absolute: 0 10*3/uL (ref 0.0–0.1)
Eosinophils Absolute: 0.3 10*3/uL (ref 0.0–0.7)
HCT: 38.1 % (ref 36.0–46.0)
Hemoglobin: 12.9 g/dL (ref 12.0–15.0)
Lymphocytes Relative: 32.8 % (ref 12.0–46.0)
Lymphs Abs: 1.6 10*3/uL (ref 0.7–4.0)
MCHC: 33.9 g/dL (ref 30.0–36.0)
Monocytes Relative: 5.9 % (ref 3.0–12.0)
Neutro Abs: 2.7 10*3/uL (ref 1.4–7.7)
Platelets: 194 10*3/uL (ref 150.0–400.0)
RDW: 12.4 % (ref 11.5–14.6)

## 2012-07-05 LAB — BASIC METABOLIC PANEL
BUN: 15 mg/dL (ref 6–23)
CO2: 26 mEq/L (ref 19–32)
Calcium: 9.2 mg/dL (ref 8.4–10.5)
GFR: 72.74 mL/min (ref >60.00)
Glucose, Bld: 115 mg/dL — ABNORMAL HIGH (ref 70–99)
Sodium: 137 mEq/L (ref 135–145)

## 2012-07-05 LAB — HEPATIC FUNCTION PANEL
ALT: 22 U/L (ref 0–35)
AST: 22 U/L (ref 0–37)
Bilirubin, Direct: 0.1 mg/dL (ref 0.0–0.3)
Total Bilirubin: 0.5 mg/dL (ref 0.3–1.2)

## 2012-07-05 LAB — TSH: TSH: 0.81 u[IU]/mL (ref 0.35–5.50)

## 2012-07-05 LAB — LIPID PANEL: Total CHOL/HDL Ratio: 3

## 2012-07-05 NOTE — Patient Instructions (Signed)
It was good to see you today. Health Maintenance reviewed - all recommended immunizations and age-appropriate screenings are up-to-date. Test(s) ordered today. Your results will be released to MyChart (or called to you) after review, usually within 72hours after test completion. If any changes need to be made, you will be notified at that same time. Medications reviewed, no changes at this time. Please schedule followup in 1 year for physical and labs, call sooner if problems. We have signed employer form verifying your physical today and returned to you as requested  Health Maintenance, Females A healthy lifestyle and preventative care can promote health and wellness.  Maintain regular health, dental, and eye exams.   Eat a healthy diet. Foods like vegetables, fruits, whole grains, low-fat dairy products, and lean protein foods contain the nutrients you need without too many calories. Decrease your intake of foods high in solid fats, added sugars, and salt. Get information about a proper diet from your caregiver, if necessary.   Regular physical exercise is one of the most important things you can do for your health. Most adults should get at least 150 minutes of moderate-intensity exercise (any activity that increases your heart rate and causes you to sweat) each week. In addition, most adults need muscle-strengthening exercises on 2 or more days a week.     Maintain a healthy weight. The body mass index (BMI) is a screening tool to identify possible weight problems. It provides an estimate of body fat based on height and weight. Your caregiver can help determine your BMI, and can help you achieve or maintain a healthy weight. For adults 20 years and older:   A BMI below 18.5 is considered underweight.   A BMI of 18.5 to 24.9 is normal.   A BMI of 25 to 29.9 is considered overweight.   A BMI of 30 and above is considered obese.   Maintain normal blood lipids and cholesterol by exercising  and minimizing your intake of saturated fat. Eat a balanced diet with plenty of fruits and vegetables. Blood tests for lipids and cholesterol should begin at age 57 and be repeated every 5 years. If your lipid or cholesterol levels are high, you are over 50, or you are a high risk for heart disease, you may need your cholesterol levels checked more frequently. Ongoing high lipid and cholesterol levels should be treated with medicines if diet and exercise are not effective.   If you smoke, find out from your caregiver how to quit. If you do not use tobacco, do not start.   If you are pregnant, do not drink alcohol. If you are breastfeeding, be very cautious about drinking alcohol. If you are not pregnant and choose to drink alcohol, do not exceed 1 drink per day. One drink is considered to be 12 ounces (355 mL) of beer, 5 ounces (148 mL) of wine, or 1.5 ounces (44 mL) of liquor.   Avoid use of street drugs. Do not share needles with anyone. Ask for help if you need support or instructions about stopping the use of drugs.   High blood pressure causes heart disease and increases the risk of stroke. Blood pressure should be checked at least every 1 to 2 years. Ongoing high blood pressure should be treated with medicines, if weight loss and exercise are not effective.   If you are 28 to 28 years old, ask your caregiver if you should take aspirin to prevent strokes.   Diabetes screening involves taking a blood sample  to check your fasting blood sugar level. This should be done once every 3 years, after age 42, if you are within normal weight and without risk factors for diabetes. Testing should be considered at a younger age or be carried out more frequently if you are overweight and have at least 1 risk factor for diabetes.   Breast cancer screening is essential preventative care for women. You should practice "breast self-awareness." This means understanding the normal appearance and feel of your breasts  and may include breast self-examination. Any changes detected, no matter how small, should be reported to a caregiver. Women in their 60s and 30s should have a clinical breast exam (CBE) by a caregiver as part of a regular health exam every 1 to 3 years. After age 68, women should have a CBE every year. Starting at age 64, women should consider having a mammogram (breast X-ray) every year. Women who have a family history of breast cancer should talk to their caregiver about genetic screening. Women at a high risk of breast cancer should talk to their caregiver about having an MRI and a mammogram every year.   The Pap test is a screening test for cervical cancer. Women should have a Pap test starting at age 72. Between ages 63 and 34, Pap tests should be repeated every 2 years. Beginning at age 61, you should have a Pap test every 3 years as long as the past 3 Pap tests have been normal. If you had a hysterectomy for a problem that was not cancer or a condition that could lead to cancer, then you no longer need Pap tests. If you are between ages 18 and 77, and you have had normal Pap tests going back 10 years, you no longer need Pap tests. If you have had past treatment for cervical cancer or a condition that could lead to cancer, you need Pap tests and screening for cancer for at least 20 years after your treatment. If Pap tests have been discontinued, risk factors (such as a new sexual partner) need to be reassessed to determine if screening should be resumed. Some women have medical problems that increase the chance of getting cervical cancer. In these cases, your caregiver may recommend more frequent screening and Pap tests.   The human papillomavirus (HPV) test is an additional test that may be used for cervical cancer screening. The HPV test looks for the virus that can cause the cell changes on the cervix. The cells collected during the Pap test can be tested for HPV. The HPV test could be used to screen  women aged 38 years and older, and should be used in women of any age who have unclear Pap test results. After the age of 20, women should have HPV testing at the same frequency as a Pap test.   Colorectal cancer can be detected and often prevented. Most routine colorectal cancer screening begins at the age of 36 and continues through age 34. However, your caregiver may recommend screening at an earlier age if you have risk factors for colon cancer. On a yearly basis, your caregiver may provide home test kits to check for hidden blood in the stool. Use of a small camera at the end of a tube, to directly examine the colon (sigmoidoscopy or colonoscopy), can detect the earliest forms of colorectal cancer. Talk to your caregiver about this at age 53, when routine screening begins. Direct examination of the colon should be repeated every 5 to 10 years  through age 40, unless early forms of pre-cancerous polyps or small growths are found.   Hepatitis C blood testing is recommended for all people born from [birth year] through 1965 and any individual with known risks for hepatitis C.   Practice safe sex. Use condoms and avoid high-risk sexual practices to reduce the spread of sexually transmitted infections (STIs). Sexually active women aged 17 and younger should be checked for Chlamydia, which is a common sexually transmitted infection. Older women with new or multiple partners should also be tested for Chlamydia. Testing for other STIs is recommended if you are sexually active and at increased risk.   Osteoporosis is a disease in which the bones lose minerals and strength with aging. This can result in serious bone fractures. The risk of osteoporosis can be identified using a bone density scan. Women ages 35 and over and women at risk for fractures or osteoporosis should discuss screening with their caregivers. Ask your caregiver whether you should be taking a calcium supplement or vitamin D to reduce the rate of  osteoporosis.   Menopause can be associated with physical symptoms and risks. Hormone replacement therapy is available to decrease symptoms and risks. You should talk to your caregiver about whether hormone replacement therapy is right for you.   Use sunscreen with a sun protection factor (SPF) of 30 or greater. Apply sunscreen liberally and repeatedly throughout the day. You should seek shade when your shadow is shorter than you. Protect yourself by wearing long sleeves, pants, a wide-brimmed hat, and sunglasses year round, whenever you are outdoors.   Notify your caregiver of new moles or changes in moles, especially if there is a change in shape or color. Also notify your caregiver if a mole is larger than the size of a pencil eraser.   Stay current with your immunizations.  Document Released: 04/03/2011 Document Revised: 12/11/2011 Document Reviewed: 04/03/2011 [facility] Patient Information 2013 [place], [place].

## 2012-07-05 NOTE — Progress Notes (Signed)
Subjective:    Patient ID: Kaitlyn Sutton, female    DOB: 1986/09/02, 26 y.o.   MRN: [id number]  HPI  patient is here today for annual physical. Patient feels well today overall  occassional insomnia - uses Ambien prn with relief - denies depression or anxiety symptoms   Past Medical History  Diagnosis Date  . ANXIETY   . DEPRESSION   . GERD   . VENEREAL WART   . Scleritis of right eye 01/2012    etiology uknown - improved with opth steroid course   Family History  Problem Relation Age of Onset  . Alcohol abuse Mother   . Prostate cancer Maternal Uncle   . Heart attack Paternal Grandfather   . Alcohol abuse Other     grandparents  . Hypertension Other     grandparents  . Hyperlipidemia Other     grandparents  . Thyroid nodules Mother    History  Substance Use Topics  . Smoking status: Former Games developer  . Smokeless tobacco: Not on file   Comment: Single- lives with parents. Grad [employer] 09/2009 BA. Works Scientist, research (medical) for [employer]  . Alcohol Use: No    Review of Systems  Constitutional: Negative for fever. Positive for chronic insomnia, unchanged. Respiratory: Negative for cough and shortness of breath.   Cardiovascular: Negative for chest pain or palpitations.  Gastrointestinal: Negative for abdominal pain, nausea and vomiting or bowel changes.  Musculoskeletal: Negative for gait problem or joint swelling.  Skin: Negative for rash.  Neurological: Negative for dizziness.  No other specific complaints in a complete review of systems (except as listed in HPI above).     Objective:   Physical Exam  BP 110/68  Pulse 73  Temp 98.4 F (36.9 C) (Oral)  Ht 5\' 2"  (1.575 m)  Wt 103 lb (46.72 kg)  BMI 18.84 kg/m2  SpO2 98% Wt Readings from Last 3 Encounters:  07/05/12 103 lb (46.72 kg)  04/12/12 104 lb 6.4 oz (47.356 kg)  02/14/12 101 lb (45.813 kg)   Constitutional: She is thin. She appears well-developed. No distress.  HENT: Head: Normocephalic and  atraumatic. Ears: B TMs ok, no erythema or effusion; Nose: Nose normal. Mouth/Throat: Oropharynx is clear and moist. No oropharyngeal exudate.  Eyes: Conjunctivae and EOM are normal. Pupils are equal, round, and reactive to light. No scleral icterus.  Neck: Normal range of motion. Neck supple. No JVD present. No thyromegaly present.  Cardiovascular: Normal rate, regular rhythm and normal heart sounds.  No murmur heard. No BLE edema. Pulmonary/Chest: Effort normal and breath sounds normal. No respiratory distress. She has no wheezes.  Abdominal: Soft. Bowel sounds are normal. She exhibits no distension. There is no tenderness. no masses GU/pelvic - defer to gyn Musculoskeletal: Normal range of motion, no joint effusions. No gross deformities Neurological: She is alert and oriented to person, place, and time. No cranial nerve deficit. Coordination normal.  Skin: Skin is warm and dry. No rash noted. No erythema.  Psychiatric: She has a normal mood and affect. Her behavior is normal. Judgment and thought content normal.   Lab Results  Component Value Date   WBC 4.9 06/08/2011   HGB 13.1 06/08/2011   HCT 38.5 06/08/2011   PLT 195.0 06/08/2011   CHOL 161 06/08/2011   TRIG 80.0 06/08/2011   HDL 63.40 06/08/2011   ALT 20 06/08/2011   AST 24 06/08/2011   NA 138 06/08/2011   K 3.9 06/08/2011   CL 101 06/08/2011   CREATININE 0.8  06/08/2011   BUN 14 06/08/2011   CO2 24 06/08/2011   TSH 1.04 06/08/2011      Assessment & Plan:  CPX - v70.0 - Patient has been counseled on age-appropriate routine health concerns for screening and prevention. These are reviewed and up-to-date. Immunizations are up-to-date or declined. Labs ordered and will be reviewed.

## 2012-12-24 ENCOUNTER — Telehealth: Payer: Self-pay | Admitting: Nurse Practitioner

## 2012-12-24 MED ORDER — NORETHIN ACE-ETH ESTRAD-FE 1-20 MG-MCG PO TABS: 1.0000 | ORAL_TABLET | Freq: Every day | ORAL | Status: DC

## 2012-12-24 NOTE — Telephone Encounter (Signed)
Pt wants a refill for Junel fe called into ([facility] [address] [phone])

## 2012-12-24 NOTE — Telephone Encounter (Signed)
When is next AEX and is it scheduled.  If so then can refill until AEX. PG

## 2012-12-24 NOTE — Telephone Encounter (Signed)
Ok to refill  X 1 

## 2012-12-25 ENCOUNTER — Other Ambulatory Visit: Payer: Self-pay | Admitting: Orthopedic Surgery

## 2012-12-25 NOTE — Telephone Encounter (Signed)
Call to pt to inform one refill sent to [facility]. Pt appreciative. aa

## 2012-12-25 NOTE — Telephone Encounter (Signed)
One refill of Junel Fe 1/20 sent to pt's pharmacy. aa

## 2013-01-14 ENCOUNTER — Ambulatory Visit (INDEPENDENT_AMBULATORY_CARE_PROVIDER_SITE_OTHER): Payer: 59 | Admitting: Nurse Practitioner

## 2013-01-14 ENCOUNTER — Encounter: Payer: Self-pay | Admitting: Nurse Practitioner

## 2013-01-14 VITALS — BP 98/62 | HR 70 | Resp 16 | Ht 63.0 in | Wt 103.0 lb

## 2013-01-14 DIAGNOSIS — Z01419 Encounter for gynecological examination (general) (routine) without abnormal findings: Secondary | ICD-10-CM

## 2013-01-14 DIAGNOSIS — Z309 Encounter for contraceptive management, unspecified: Secondary | ICD-10-CM

## 2013-01-14 DIAGNOSIS — Z113 Encounter for screening for infections with a predominantly sexual mode of transmission: Secondary | ICD-10-CM

## 2013-01-14 MED ORDER — NORETHIN ACE-ETH ESTRAD-FE 1-20 MG-MCG PO TABS: 1.0000 | ORAL_TABLET | Freq: Every day | ORAL | Status: DC

## 2013-01-14 NOTE — Patient Instructions (Addendum)

## 2013-01-14 NOTE — Progress Notes (Signed)
27 y.o. [id number] Single Caucasian Fe here for annual exam.  Amenorrhea on OCP LMP 11/08/09. No spotting on off week.  Occasionally very mild cramps. Same partner for 6 months. He has no hx of STD's but would like checked.  Patient's last menstrual period was 11/08/2009.          Sexually active: yes  The current method of family planning is OCP (estrogen/progesterone).    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 She has never used smokeless tobacco. She reports that  drinks alcohol. She reports that she uses illicit drugs.  Past Medical History  Diagnosis Date  . ANXIETY   . DEPRESSION   . GERD   . VENEREAL WART   . 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 No past surgeries      Current Outpatient Prescriptions  Medication Sig Dispense Refill  . norethindrone-ethinyl estradiol (JUNEL FE,GILDESS FE,LOESTRIN FE) 1-20 MG-MCG tablet Take 1 tablet by mouth daily.  1 Package  1   No current facility-administered medications for this visit.    Family History  Problem Relation Age of Onset  . Alcohol abuse Mother   . Prostate cancer Maternal Uncle   . Heart attack Paternal Grandfather   . Alcohol abuse Other     grandparents  . Hypertension Other     grandparents  . Hyperlipidemia Other     grandparents  . Thyroid nodules Mother     ROS:  Pertinent items are noted in HPI.  Otherwise, a comprehensive ROS was negative.  Exam:   BP 98/62  Pulse 70  Resp 16  Ht 5\' 3"  (1.6 m)  Wt 103 lb (46.72 kg)  BMI 18.25 kg/m2  LMP 11/08/2009  Height: 5\' 3"  (160 cm)  Ht Readings from Last 3 Encounters:  01/14/13 5\' 3"  (1.6 m)  07/05/12 5\' 2"  (1.575 m)  04/12/12 5\' 2"  (1.575 m)    General appearance: alert, cooperative  and appears stated age Head: Normocephalic, without obvious abnormality, atraumatic Neck: no adenopathy, supple, symmetrical, trachea midline and thyroid normal to inspection and palpation Lungs: clear to auscultation bilaterally Breasts: normal appearance, no masses or tenderness Heart: regular rate and rhythm Abdomen: soft, non-tender; no masses,  no organomegaly Extremities: extremities normal, atraumatic, no cyanosis or edema Skin: Skin color, texture, turgor normal. No rashes or lesions Lymph nodes: Cervical, supraclavicular, and axillary nodes normal. No abnormal inguinal nodes palpated Neurologic: Grossly normal   Pelvic: External genitalia:  no lesions              Urethra:  normal appearing urethra with no masses, tenderness or lesions              Bartholin's and Skene's: normal                 Vagina: normal appearing vagina with normal color and discharge, no lesions              Cervix: anteverted              Pap taken: yes along with GC / chl Bimanual Exam:  Uterus:  normal size, contour, position, consistency, mobility, non-tender  Adnexa: normal adnexa               Rectovaginal: Confirms               Anus:  normal sphincter tone, no lesions  A:  Well Woman with normal exam  Contraception continuous active OCP secondary to dysmenorrhea history  R/O Std's  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   RF OCP   Return annually or prn  An After Visit Summary was printed and given to the patient.

## 2013-01-15 LAB — STD PANEL: HIV: NONREACTIVE

## 2013-01-16 NOTE — Progress Notes (Signed)
Encounter reviewed by Dr. Gardenia Witter Silva.  

## 2013-01-17 LAB — IPS N GONORRHOEA AND CHLAMYDIA BY PCR

## 2013-01-20 ENCOUNTER — Telehealth: Payer: Self-pay | Admitting: Nurse Practitioner

## 2013-01-20 NOTE — Telephone Encounter (Signed)
Message copied by Osie Bond on Mon Jan 20, 2013  2:08 PM ------      Message from: Ria Comment R      Created: Mon Jan 20, 2013  8:16 AM       Let patient know normal pap and negative STD's. ------

## 2013-01-20 NOTE — Telephone Encounter (Signed)
Pt saw her results on my chart and has some questions

## 2013-01-20 NOTE — Telephone Encounter (Signed)
Message copied by Osie Bond on Mon Jan 20, 2013  2:06 PM ------      Message from: Ria Comment R      Created: Mon Jan 20, 2013  8:16 AM       Let patient know normal pap and negative STD's. ------

## 2013-01-20 NOTE — Telephone Encounter (Signed)
Spoke with pt who was trying to view her STD results and could not see them all. Informed pt they were negative. Pt also saw that her Pap mentioned she had bacteria suggestive of BV, which she had back in January. Pt was treated with Flagyl then and did not like it. If pt needs treatment now, she would rather use metrogel, which worked better for her. Please advise.

## 2013-01-21 ENCOUNTER — Other Ambulatory Visit: Payer: Self-pay | Admitting: Nurse Practitioner

## 2013-01-21 ENCOUNTER — Telehealth: Payer: Self-pay | Admitting: Orthopedic Surgery

## 2013-01-21 ENCOUNTER — Other Ambulatory Visit: Payer: Self-pay | Admitting: Orthopedic Surgery

## 2013-01-21 DIAGNOSIS — N760 Acute vaginitis: Secondary | ICD-10-CM

## 2013-01-21 MED ORDER — METRONIDAZOLE 0.75 % VA GEL: 1.0000 | Freq: Every day | VAGINAL | Status: DC

## 2013-01-21 NOTE — Telephone Encounter (Signed)
I did not see this note until now, but metrogel order was put through to pharmacy that was listed in chart.  Her OCP was sent through during the visit here on 4/14   ([facility])

## 2013-01-21 NOTE — Progress Notes (Signed)
Pt is aware of Rx sent to pharmacy  

## 2013-01-21 NOTE — Telephone Encounter (Signed)
Spoke with pt who is having symptoms of BV like she did before and was treated with flagyl, which upset her stomach. Pt is wondering if PG would let her try metrogel instead. Pt saw on her Pap results that she had bacteria suggestive of BV, and has since developed symptoms. Pt also needs a refill of OCP. Pt uses [facility] on [address]. Please advise.

## 2013-01-21 NOTE — Telephone Encounter (Signed)
Spoke with pt to advise her prescriptions were sent to [facility]. Pt appreciative.

## 2013-01-21 NOTE — Telephone Encounter (Signed)
Message copied by Osie Bond on Tue Jan 21, 2013  3:58 PM ------      Message from: Ria Comment R      Created: Tue Jan 21, 2013  1:31 PM       Rx has been sent in to pharmacy as listed ------

## 2013-07-28 ENCOUNTER — Ambulatory Visit (INDEPENDENT_AMBULATORY_CARE_PROVIDER_SITE_OTHER): Payer: 59 | Admitting: Internal Medicine

## 2013-07-28 ENCOUNTER — Other Ambulatory Visit (INDEPENDENT_AMBULATORY_CARE_PROVIDER_SITE_OTHER): Payer: 59

## 2013-07-28 ENCOUNTER — Encounter: Payer: Self-pay | Admitting: Internal Medicine

## 2013-07-28 VITALS — BP 100/72 | HR 69 | Temp 98.0°F | Ht 62.0 in | Wt 100.2 lb

## 2013-07-28 DIAGNOSIS — Z113 Encounter for screening for infections with a predominantly sexual mode of transmission: Secondary | ICD-10-CM

## 2013-07-28 DIAGNOSIS — Z0000 Encounter for general adult medical examination without abnormal findings: Secondary | ICD-10-CM

## 2013-07-28 LAB — HEPATIC FUNCTION PANEL
ALT: 23 U/L (ref 0–35)
Albumin: 4.2 g/dL (ref 3.5–5.2)
Bilirubin, Direct: 0.1 mg/dL (ref 0.0–0.3)
Total Protein: 7.5 g/dL (ref 6.0–8.3)

## 2013-07-28 LAB — URINALYSIS, ROUTINE W REFLEX MICROSCOPIC
Bilirubin Urine: NEGATIVE
Hgb urine dipstick: NEGATIVE
Ketones, ur: NEGATIVE
Leukocytes, UA: NEGATIVE
Nitrite: NEGATIVE
Total Protein, Urine: NEGATIVE
Urine Glucose: NEGATIVE
Urobilinogen, UA: 0.2 (ref 0.0–1.0)
pH: 5.5 (ref 5.0–8.0)

## 2013-07-28 LAB — BASIC METABOLIC PANEL
BUN: 15 mg/dL (ref 6–23)
Chloride: 104 mEq/L (ref 96–112)
GFR: 77.71 mL/min (ref >60.00)
Potassium: 4.3 mEq/L (ref 3.5–5.1)
Sodium: 140 mEq/L (ref 135–145)

## 2013-07-28 LAB — LIPID PANEL
Cholesterol: 175 mg/dL (ref 0–200)
HDL: 70.2 mg/dL (ref >39.00)
LDL Cholesterol: 86 mg/dL (ref 0–99)
Total CHOL/HDL Ratio: 2
Triglycerides: 93 mg/dL (ref 0.0–149.0)
VLDL: 18.6 mg/dL (ref 0.0–40.0)

## 2013-07-28 LAB — CBC WITH DIFFERENTIAL/PLATELET
Basophils Relative: 0.2 % (ref 0.0–3.0)
Eosinophils Absolute: 0.4 10*3/uL (ref 0.0–0.7)
Eosinophils Relative: 7 % — ABNORMAL HIGH (ref 0.0–5.0)
HCT: 39.4 % (ref 36.0–46.0)
Lymphs Abs: 2.3 10*3/uL (ref 0.7–4.0)
MCV: 95.4 fl (ref 78.0–100.0)
Monocytes Absolute: 0.4 10*3/uL (ref 0.1–1.0)
Monocytes Relative: 7.3 % (ref 3.0–12.0)
Platelets: 186 10*3/uL (ref 150.0–400.0)
RBC: 4.13 Mil/uL (ref 3.87–5.11)
WBC: 5.9 10*3/uL (ref 4.5–10.5)

## 2013-07-28 LAB — TSH: TSH: 1.25 u[IU]/mL (ref 0.35–5.50)

## 2013-07-28 NOTE — Progress Notes (Signed)
Subjective:    Patient ID: Kaitlyn Sutton, female    DOB: Feb 05, 1986, 27 y.o.   MRN: [id number]  HPI  patient is here today for annual physical. Patient feels well today overall  Requesting HIV/STD check today.   Past Medical History  Diagnosis Date  . ANXIETY   . DEPRESSION   . GERD   . VENEREAL WART   . Scleritis of right eye 01/2012    etiology uknown - improved with opth steroid course   Family History  Problem Relation Age of Onset  . Alcohol abuse Mother   . Prostate cancer Maternal Uncle   . Heart attack Paternal Grandfather   . Alcohol abuse Other     grandparents  . Hypertension Other     grandparents  . Hyperlipidemia Other     grandparents  . Thyroid nodules Mother    History  Substance Use Topics  . Smoking status: Former Games developer  . Smokeless tobacco: Never Used     Comment: Single- lives with parents. Grad [employer] 09/2009 BA. Works Scientist, research (medical) for [employer]  . Alcohol Use: Yes    Review of Systems  Constitutional: Negative for fever, unintentional weight gain or weight loss Respiratory: Negative for cough and shortness of breath.   Cardiovascular: Negative for chest pain or palpitations.  Gastrointestinal: Negative for abdominal pain, nausea and vomiting or bowel changes.  Musculoskeletal: Negative for gait problem or joint swelling.  Skin: Negative for rash.  Neurological: Negative for dizziness.  No other specific complaints in a complete review of systems (except as listed in HPI above).     Objective:   Physical Exam  BP 100/72  Pulse 69  Temp(Src) 98 F (36.7 C) (Oral)  Ht 5\' 2"  (1.575 m)  Wt 100 lb 2.4 oz (45.428 kg)  BMI 18.31 kg/m2  SpO2 98% Wt Readings from Last 3 Encounters:  07/28/13 100 lb 2.4 oz (45.428 kg)  01/14/13 103 lb (46.72 kg)  07/05/12 103 lb (46.72 kg)   Constitutional: She is thin. She appears well-developed. No distress.  HENT: Head: Normocephalic and atraumatic. Ears: B TMs ok, no erythema or effusion; Nose:  Nose normal. Mouth/Throat: Oropharynx is clear and moist. No oropharyngeal exudate.  Eyes: Conjunctivae and EOM are normal. Pupils are equal, round, and reactive to light. No scleral icterus.  Neck: Normal range of motion. Neck supple. No JVD present. No thyromegaly present.  Cardiovascular: Normal rate, regular rhythm and normal heart sounds.  No murmur heard. No BLE edema. Pulmonary/Chest: Effort normal and breath sounds normal. No respiratory distress. She has no wheezes.  Abdominal: Soft. Bowel sounds are normal. She exhibits no distension. There is no tenderness. no masses GU/pelvic - defer to gyn Musculoskeletal: Normal range of motion, no joint effusions. No gross deformities Neurological: She is alert and oriented to person, place, and time. No cranial nerve deficit. Coordination normal.  Skin: Skin is warm and dry. No rash noted. No erythema.  Psychiatric: She has a normal mood and affect. Her behavior is normal. Judgment and thought content normal.   Lab Results  Component Value Date   WBC 4.9 07/05/2012   HGB 12.9 07/05/2012   HCT 38.1 07/05/2012   PLT 194.0 07/05/2012   CHOL 161 07/05/2012   TRIG 154.0* 07/05/2012   HDL 63.60 07/05/2012   ALT 22 07/05/2012   AST 22 07/05/2012   NA 137 07/05/2012   K 3.9 07/05/2012   CL 105 07/05/2012   CREATININE 1.0 07/05/2012   BUN 15 07/05/2012  CO2 26 07/05/2012   TSH 0.81 07/05/2012      Assessment & Plan:  CPX - v70.0 - Patient has been counseled on age-appropriate routine health concerns for screening and prevention. These are reviewed and up-to-date. Immunizations are up-to-date or declined. Labs ordered and will be reviewed.  High risk sexual behavior. Patient request STI testing. HIV and urine GC Chlamydia ordered. Counseling provided regarding safe sexual behavior

## 2013-07-28 NOTE — Progress Notes (Signed)
Pre-visit discussion using our clinic review tool. No additional management support is needed unless otherwise documented below in the visit note.  

## 2013-07-28 NOTE — Patient Instructions (Addendum)
It was good to see you today.  We have reviewed your prior records including labs and tests today  Test(s) ordered today. Your results will be released to MyChart (or called to you) after review, usually within 72hours after test completion. If any changes need to be made, you will be notified at that same time.  Health Maintenance reviewed - all recommended immunizations and age-appropriate screenings are up-to-date.  Medications reviewed and updated, no changes recommended at this time.  Please schedule followup in 1-2 years for annual exam and labs, call sooner if problems.  Health Maintenance, Females A healthy lifestyle and preventative care can promote health and wellness.  Maintain regular health, dental, and eye exams.  Eat a healthy diet. Foods like vegetables, fruits, whole grains, low-fat dairy products, and lean protein foods contain the nutrients you need without too many calories. Decrease your intake of foods high in solid fats, added sugars, and salt. Get information about a proper diet from your caregiver, if necessary.  Regular physical exercise is one of the most important things you can do for your health. Most adults should get at least 150 minutes of moderate-intensity exercise (any activity that increases your heart rate and causes you to sweat) each week. In addition, most adults need muscle-strengthening exercises on 2 or more days a week.   Maintain a healthy weight. The body mass index (BMI) is a screening tool to identify possible weight problems. It provides an estimate of body fat based on height and weight. Your caregiver can help determine your BMI, and can help you achieve or maintain a healthy weight. For adults 20 years and older:  A BMI below 18.5 is considered underweight.  A BMI of 18.5 to 24.9 is normal.  A BMI of 25 to 29.9 is considered overweight.  A BMI of 30 and above is considered obese.  Maintain normal blood lipids and cholesterol by  exercising and minimizing your intake of saturated fat. Eat a balanced diet with plenty of fruits and vegetables. Blood tests for lipids and cholesterol should begin at age 34 and be repeated every 5 years. If your lipid or cholesterol levels are high, you are over 50, or you are a high risk for heart disease, you may need your cholesterol levels checked more frequently.Ongoing high lipid and cholesterol levels should be treated with medicines if diet and exercise are not effective.  If you smoke, find out from your caregiver how to quit. If you do not use tobacco, do not start.  If you are pregnant, do not drink alcohol. If you are breastfeeding, be very cautious about drinking alcohol. If you are not pregnant and choose to drink alcohol, do not exceed 1 drink per day. One drink is considered to be 12 ounces (355 mL) of beer, 5 ounces (148 mL) of wine, or 1.5 ounces (44 mL) of liquor.  Avoid use of street drugs. Do not share needles with anyone. Ask for help if you need support or instructions about stopping the use of drugs.  High blood pressure causes heart disease and increases the risk of stroke. Blood pressure should be checked at least every 1 to 2 years. Ongoing high blood pressure should be treated with medicines, if weight loss and exercise are not effective.  If you are 38 to 29 years old, ask your caregiver if you should take aspirin to prevent strokes.  Diabetes screening involves taking a blood sample to check your fasting blood sugar level. This should be done  once every 3 years, after age 51, if you are within normal weight and without risk factors for diabetes. Testing should be considered at a younger age or be carried out more frequently if you are overweight and have at least 1 risk factor for diabetes.  Breast cancer screening is essential preventative care for women. You should practice "breast self-awareness." This means understanding the normal appearance and feel of your  breasts and may include breast self-examination. Any changes detected, no matter how small, should be reported to a caregiver. Women in their 46s and 30s should have a clinical breast exam (CBE) by a caregiver as part of a regular health exam every 1 to 3 years. After age 73, women should have a CBE every year. Starting at age 48, women should consider having a mammogram (breast X-ray) every year. Women who have a family history of breast cancer should talk to their caregiver about genetic screening. Women at a high risk of breast cancer should talk to their caregiver about having an MRI and a mammogram every year.  The Pap test is a screening test for cervical cancer. Women should have a Pap test starting at age 34. Between ages 36 and 43, Pap tests should be repeated every 2 years. Beginning at age 62, you should have a Pap test every 3 years as long as the past 3 Pap tests have been normal. If you had a hysterectomy for a problem that was not cancer or a condition that could lead to cancer, then you no longer need Pap tests. If you are between ages 39 and 88, and you have had normal Pap tests going back 10 years, you no longer need Pap tests. If you have had past treatment for cervical cancer or a condition that could lead to cancer, you need Pap tests and screening for cancer for at least 20 years after your treatment. If Pap tests have been discontinued, risk factors (such as a new sexual partner) need to be reassessed to determine if screening should be resumed. Some women have medical problems that increase the chance of getting cervical cancer. In these cases, your caregiver may recommend more frequent screening and Pap tests.  The human papillomavirus (HPV) test is an additional test that may be used for cervical cancer screening. The HPV test looks for the virus that can cause the cell changes on the cervix. The cells collected during the Pap test can be tested for HPV. The HPV test could be used to  screen women aged 69 years and older, and should be used in women of any age who have unclear Pap test results. After the age of 24, women should have HPV testing at the same frequency as a Pap test.  Colorectal cancer can be detected and often prevented. Most routine colorectal cancer screening begins at the age of 45 and continues through age 50. However, your caregiver may recommend screening at an earlier age if you have risk factors for colon cancer. On a yearly basis, your caregiver may provide home test kits to check for hidden blood in the stool. Use of a small camera at the end of a tube, to directly examine the colon (sigmoidoscopy or colonoscopy), can detect the earliest forms of colorectal cancer. Talk to your caregiver about this at age 32, when routine screening begins. Direct examination of the colon should be repeated every 5 to 10 years through age 70, unless early forms of pre-cancerous polyps or small growths are found.  Hepatitis C blood testing is recommended for all people born from [birth year] through 1967 and any individual with known risks for hepatitis C.  Practice safe sex. Use condoms and avoid high-risk sexual practices to reduce the spread of sexually transmitted infections (STIs). Sexually active women aged 32 and younger should be checked for Chlamydia, which is a common sexually transmitted infection. Older women with new or multiple partners should also be tested for Chlamydia. Testing for other STIs is recommended if you are sexually active and at increased risk.  Osteoporosis is a disease in which the bones lose minerals and strength with aging. This can result in serious bone fractures. The risk of osteoporosis can be identified using a bone density scan. Women ages 57 and over and women at risk for fractures or osteoporosis should discuss screening with their caregivers. Ask your caregiver whether you should be taking a calcium supplement or vitamin D to reduce the rate of  osteoporosis.  Menopause can be associated with physical symptoms and risks. Hormone replacement therapy is available to decrease symptoms and risks. You should talk to your caregiver about whether hormone replacement therapy is right for you.  Use sunscreen with a sun protection factor (SPF) of 30 or greater. Apply sunscreen liberally and repeatedly throughout the day. You should seek shade when your shadow is shorter than you. Protect yourself by wearing long sleeves, pants, a wide-brimmed hat, and sunglasses year round, whenever you are outdoors.  Notify your caregiver of new moles or changes in moles, especially if there is a change in shape or color. Also notify your caregiver if a mole is larger than the size of a pencil eraser.  Stay current with your immunizations. Document Released: 04/03/2011 Document Revised: 12/11/2011 Document Reviewed: 04/03/2011 [facility] Patient Information 2014 [place], [place]. Sexually Transmitted Disease Sexually transmitted disease (STD) refers to any infection that is passed from person to person during sexual activity. This may happen by way of saliva, semen, blood, vaginal mucus, or urine. Common STDs include:  Gonorrhea.  Chlamydia.  Syphilis.  HIV/AIDS.  Genital herpes.  Hepatitis B and C.  Trichomonas.  Human papillomavirus (HPV).  Pubic lice. CAUSES  An STD may be spread by bacteria, virus, or parasite. A person can get an STD by:  Sexual intercourse with an infected person.  Sharing sex toys with an infected person.  Sharing needles with an infected person.  Having intimate contact with the genitals, mouth, or rectal areas of an infected person. SYMPTOMS  Some people may not have any symptoms, but they can still pass the infection to others. Different STDs have different symptoms. Symptoms include:  Painful or bloody urination.  Pain in the pelvis, abdomen, vagina, anus, throat, or eyes.  Skin rash, itching, irritation,  growths, or sores (lesions). These usually occur in the genital or anal area.  Abnormal vaginal discharge.  Penile discharge in men.  Soft, flesh-colored skin growths in the genital or anal area.  Fever.  Pain or bleeding during sexual intercourse.  Swollen glands in the groin area.  Yellow skin and eyes (jaundice). This is seen with hepatitis. DIAGNOSIS  To make a diagnosis, your caregiver may:  Take a medical history.  Perform a physical exam.  Take a specimen (culture) to be examined.  Examine a sample of discharge under a microscope.  Perform blood tests.  Perform a Pap test, if this applies.  Perform a colposcopy.  Perform a laparoscopy. TREATMENT   Chlamydia, gonorrhea, trichomonas, and syphilis can be cured with  antibiotic medicine.  Genital herpes, hepatitis, and HIV can be treated, but not cured, with prescribed medicines. The medicines will lessen the symptoms.  Genital warts from HPV can be treated with medicine or by freezing, burning (electrocautery), or surgery. Warts may come back.  HPV is a virus and cannot be cured with medicine or surgery.However, abnormal areas may be followed very closely by your caregiver and may be removed from the cervix, vagina, or vulva through office procedures or surgery. If your diagnosis is confirmed, your recent sexual partners need treatment. This is true even if they are symptom-free or have a negative culture or evaluation. They should not have sex until their caregiver says it is okay. HOME CARE INSTRUCTIONS  All sexual partners should be informed, tested, and treated for all STDs.  Take your antibiotics as directed. Finish them even if you start to feel better.  Only take over-the-counter or prescription medicines for pain, discomfort, or fever as directed by your caregiver.  Rest.  Eat a balanced diet and drink enough fluids to keep your urine clear or pale yellow.  Do not have sex until treatment is  completed and you have followed up with your caregiver. STDs should be checked after treatment.  Keep all follow-up appointments, Pap tests, and blood tests as directed by your caregiver.  Only use latex condoms and water-soluble lubricants during sexual activity. Do not use petroleum jelly or oils.  Avoid alcohol and illegal drugs.  Get vaccinated for HPV and hepatitis. If you have not received these vaccines in the past, talk to your caregiver about whether one or both might be right for you.  Avoid risky sex practices that can break the skin. The only way to avoid getting an STD is to avoid all sexual activity.Latex condoms and dental dams (for oral sex) will help lessen the risk of getting an STD, but will not completely eliminate the risk. SEEK MEDICAL CARE IF:   You have a fever.  You have any new or worsening symptoms. Document Released: 12/09/2002 Document Revised: 12/11/2011 Document Reviewed: 12/16/2010 [facility] Patient Information 2014 [place], [place].

## 2013-07-29 ENCOUNTER — Encounter: Payer: Self-pay | Admitting: Internal Medicine

## 2013-07-29 LAB — GC/CHLAMYDIA PROBE AMP, URINE
Chlamydia, Swab/Urine, PCR: NEGATIVE
GC Probe Amp, Urine: NEGATIVE

## 2013-10-10 ENCOUNTER — Encounter: Payer: Self-pay | Admitting: Nurse Practitioner

## 2013-10-10 ENCOUNTER — Ambulatory Visit (INDEPENDENT_AMBULATORY_CARE_PROVIDER_SITE_OTHER): Payer: BC Managed Care – PPO | Admitting: Nurse Practitioner

## 2013-10-10 ENCOUNTER — Ambulatory Visit: Payer: 59 | Admitting: Internal Medicine

## 2013-10-10 VITALS — BP 111/74 | HR 98 | Temp 98.0°F | Resp 18 | Ht 62.0 in | Wt 97.5 lb

## 2013-10-10 DIAGNOSIS — K146 Glossodynia: Secondary | ICD-10-CM

## 2013-10-10 DIAGNOSIS — F411 Generalized anxiety disorder: Secondary | ICD-10-CM

## 2013-10-10 DIAGNOSIS — R21 Rash and other nonspecific skin eruption: Secondary | ICD-10-CM

## 2013-10-10 DIAGNOSIS — R5381 Other malaise: Secondary | ICD-10-CM

## 2013-10-10 DIAGNOSIS — R5383 Other fatigue: Secondary | ICD-10-CM

## 2013-10-10 MED ORDER — LORAZEPAM 0.5 MG PO TABS: ORAL_TABLET | ORAL | Status: AC

## 2013-10-10 NOTE — Progress Notes (Signed)
Pre visit review using our clinic review tool, if applicable. No additional management support is needed unless otherwise documented below in the visit note. 

## 2013-10-10 NOTE — Patient Instructions (Addendum)
I am not sure what is causing your symptoms, it may be as simple as b12 deficiency or you may have a vasculitis. I have ordered preliminary blood work that will help form a diagnosis. In the meantime, start 81 mg enteric coated aspirin daily. Consider another form of birth control-barrier method is the safest if you have vasculitis. Smoking increases risks for blood clots. Use lorazepam as needed for anxiety. Please follow up in 2 weeks. Nice to meet you!   Vasculitis Vasculitis is when your blood vessels are inflamed. There are many different blood vessels in the body, and vasculitis can affect any of them. This includes large (veins and arteries) and small (capillaries) vessels. With vasculitis,   Blood vessel walls can become thick.  Blood vessels can become narrow.  Blood vessels can become weak. Sometimes, it becomes so weak that the blood vessel bulges out like a balloon. This is called an aneurysm. Aneurysms are rare but can be life-threatening.  Scarring can occur.  Not enough blood can flow through the blood vessels. All of these things can damage many parts of the body, including the muscles, kidneys, lungs and brain. There are many types of vasculitis. Some types are short-term (acute), while others are long-term (chronic). Some types may go away without treatment, and others may need to be treated for a long time.  CAUSES  Vasculitis occurs when the body's immune system (which fights germs and disease) makes a mistake. It attacks its own blood vessels. This causes inflammation (the body's way of reacting to injury or infection).   Why this happens is usually not known. The condition is then called primary vasculitis.  Sometimes, something triggers the inflammation. This is called secondary vasculitis. Possible causes include:  Infections.  An immune system disease. Examples include lupus, rheumatoid arthritis and scleroderma.  An allergic reaction to a medicine.  Cancer that  affects blood cells. This includes leukemia and lymphoma.  Males and females of all ages and races can develop vasculitis. Some risk factors make vasculitis more likely. These include:  Smoking.  Stress.  Physical injury. SYMPTOMS  There are more than 20 types of vasculitis. Symptoms of each type vary, but some symptoms are common.  Many people with vasculitis:  Have a fever.  Do not feel like eating.  Lose weight.  Feel very tired.  Have aches and pains.  Feel weak.  Start to not have feeling (numbness) in an area.  Symptoms for some types of vasculitis also could be:  Sores in the mouth or eyes.  Skin problems. This could be sores, spots or rashes.  Trouble seeing.  Trouble breathing.  Blood in the urine.  Headaches.  Pain in the abdomen.  Stuffy or bloody nose. DIAGNOSIS  Vasculitis symptoms are similar to symptoms of many other conditions. That can make it hard to tell if you have vasculitis. To be sure, your caregiver will ask about your symptoms and do a physical exam. Certain tests may be necessary, such as:   A complete blood count (CBC). This test shows how many red blood cells are in your blood. Not having enough red blood cells (anemic) can result from vasculitis.  Erythrocyte sedimentation (also called sed rate test). It measures inflammation in the body.  C reactive protein (CRP). This also shows if there is inflammation.  Anti-neutrophil cytoplasmic antibodies (ANCA). This can tell if the immune system is reacting to certain cells in the blood.  A urine test. This checks for blood or protein  in the urine. That could be a sign of kidney damage from vasculitis.  Imaging tests. These tests create pictures from inside the body. Options include:  X-rays.  Computed tomography (CT) scan. This uses X-rays guided by a computer.  Ultrasound. It creates an image using sound waves.  Magnetic resonance imaging (MRI). It uses radio waves, magnets and  a computer.  Angiography. A dye is put into your blood vessels. Then, an X-ray is taken of them.  A biopsy of a blood vessel. This means your caregiver will take out a small piece of a blood vessel. Then, it is checked under a microscope. This is an important test. It often is the best way to know for sure if you have vasculitis. TREATMENT   Treatment will depend on the type of vasculitis and how severe it is. Often, you will need to see a specialist in immunologic diseases (rheumatologist).  Some types of vasculitis may go away without treatment.  Some types need only over-the-counter drugs.  Prescription medicines are used to treat many types of vasculitis. For example:  Corticosteroids. These are the drugs used most often. They are very powerful. Usually, a high dose is taken until symptoms improve. Then, the dose is gradually decreased. Using corticosteroids for a long time can cause problems. They can make muscles and bones weak. They can cause blood pressure to go up, and cause diabetes. Also, people often gain weight when they take corticosteroids.  Cytotoxic drugs. These kill cells that cause inflammation. Sometimes, they are used if corticosteroids do not help. Other times, both medications are taken.  Surgery. This may be needed to repair a blood vessel that has bulged out (aneurysm).  Treatment can sometimes cure your disease. Other times, it can put the disease in remission (no symptoms). Increased treatment and reevaluation might be necessary if your disease comes back or flares. HOME CARE INSTRUCTIONS   Take any medications that your caregiver prescribes. Follow the directions carefully.  Watch for any problems that can be caused by a drug (side effects). Tell your caregiver right away if you notice any changes or problems.  Keep all appointments for checkups. This is important to help your caregiver watch for side effects. Checkups may include:  Periodic blood  tests.  Bone density testing. This checks how strong or weak your bones are.  Blood pressure checks. If your blood pressure rises, you may need to take a drug to control it while you are taking corticosteroids.  Blood sugar checks. This is to be sure you are not developing diabetes. If you have diabetes, corticosteroid medications may make it worse and require increased treatment.  Exercise. First, talk with your caregiver about what would be OK for you to do. Aerobic exercise (which increases your heart rate) is often suggested. It includes walking. This type of exercise is good because it helps prevent bone loss. It also helps control your blood pressure.  Follow a healthy diet. Include good sources of protein in your diet. Also include fruits, vegetables and whole grains. Your caregiver can refer you to an expert on healthy eating (dietitian) for more detailed advice.  Learn as much as you can about vasculitis. Understanding your condition can help you cope with it. Coping can be hard because this may be something you will have to live with for years.  Consider joining a support group. It often helps to talk about your worries with others who have the same problems.  Tell your caregiver if you feel  stressed, anxious or depressed. Your caregiver may refer you to a specialist, or recommend medication to relieve your symptoms. SEEK MEDICAL CARE IF:   The symptoms that led to your diagnosis return.  You develop worsening fever, fatigue, headache, weight loss or pain in your jaw.  You develop signs of infection. Infections can be worse if you are on corticosteroid medication.  You develop any new or unexplained symptoms of disease. SEEK IMMEDIATE MEDICAL CARE IF:   Your eyesight changes.  Pain does not go away, even after taking medication.  You feel pain in your chest or abdomen.  You have trouble breathing.  One side of your face or body becomes suddenly weak or numb.  Your nose  bleeds.  There is blood in your urine.  You develop a fever of more than 102 F (38.9 C). Document Released: 07/15/2009 Document Revised: 12/11/2011 Document Reviewed: 07/15/2009 [facility] Patient Information 2014 [place], [place].

## 2013-10-12 ENCOUNTER — Encounter: Payer: Self-pay | Admitting: Nurse Practitioner

## 2013-10-12 ENCOUNTER — Telehealth: Payer: Self-pay | Admitting: Nurse Practitioner

## 2013-10-12 DIAGNOSIS — I776 Arteritis, unspecified: Secondary | ICD-10-CM

## 2013-10-12 MED ORDER — PREDNISONE 10 MG PO TABS: ORAL_TABLET | ORAL | Status: AC

## 2013-10-12 NOTE — Telephone Encounter (Signed)
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

## 2013-10-12 NOTE — Progress Notes (Signed)
Subjective:     Kaitlyn Sutton is a 28 y.o. female who presents for evaluation of a rash involving the hand and fingers. Rash started 4 days ago. Lesions are pink, and flat in texture. Rash has not changed over time. Rash is associated with "pins & needles" sensation in palms & fingers. Associated symptoms: raw mouth sensation & dizziness, & stress. Patient denies: abdominal pain, arthralgia, congestion, cough, fever, headache, irritability and nausea. Patient has not had contacts with similar rash. Patient has not had new exposures (soaps, lotions, laundry detergents, foods, medications, plants, insects or animals).  The following portions of the patient's history were reviewed and updated as appropriate: allergies, current medications, past family history, past medical history, past social history, past surgical history and problem list.  Review of Systems Constitutional: negative for chills, fatigue, fevers, night sweats and BMI 17.8-has seen nutritionist in past, denies eating disorder Eyes: positive for Hx of scleritis, no f/u in 2 yrs, not using steroid drops due to expense, negative for irritation and redness Ears, nose, mouth, throat, and face: negative for hearing loss, nasal congestion, sore throat, tinnitus and voice change Respiratory: negative for cough, dyspnea on exertion and wheezing Cardiovascular: negative for chest pain, chest pressure/discomfort, irregular heart beat, lower extremity edema and near-syncope Gastrointestinal: negative for abdominal pain, change in bowel habits, constipation, diarrhea and dyspepsia Genitourinary:negative for dysuria Integument/breast: positive for rash on palms & at nailbeds, negative for pruritus Hematologic/lymphatic: negative for easy bruising and lymphadenopathy Musculoskeletal:positive for calves feel tight, negative for arthralgias, myalgias and stiff joints Neurological: positive for dizziness and pins & needles senation hands, great toes feel  numb, negative for coordination problems, gait problems, headaches, memory problems, seizures, tremors and weakness Behavioral/Psych: positive for illegal drug usage and Hx of anxiety w/treatment, denies panick attacks, no current Tx. Just changed jobs & feels very stressed, negative for loss of interest in favorite activities Allergic/Immunologic: negative for hay fever and urticaria  Mouth: c/o burning sensation tongue, gums, buccal mucosa Behav: smokes about 3 cigs/day, uses marijuana   Objective:    BP 111/74  Pulse 98  Temp(Src) 98 F (36.7 C) (Oral)  Resp 18  Ht 5\' 2"  (1.575 m)  Wt 97 lb 8 oz (44.226 kg)  BMI 17.83 kg/m2  SpO2 100% General:  alert, cooperative, appears stated age, mild distress and appears worried  Skin:      normal and pink circular macular splotches palms & fingers, & nailbeds w/mild swelling. Unable to assess nails due to acrylic nails. No telangiectasias at nailbeds, face or chest. Has reticular vascular pattern on arms, legs, & trunk, like livedo reticularis. Hands & feet warm. Face pale.   Lungs: clear bilat, effort & Rate nml Heart: RRR, no murmur ENT: petechial lesion roof of mouth, ext ears, canal & TM nml, no lesions in nose Eyes: lids & lashes clear, PERRL Head: atraumatic, symmetrical Abdomen: no HSM or masses, NT Extremities: no edema, diffuse vascular reticular pattern lower legs, enlarged 1st MTP Pulses: +2, symetrical  Assessment:    Macular erythematous, nonedematous rash hands & fingers. DD: vasculitis, infectious, allergic   Situational Anxiety with new job  Plan:    1 Prelim w/u for vasculitis, infection. Start ASA daily, consider barrier birth control or one with no estrogen. Stop smoking. 2 ativan PRN

## 2013-10-13 ENCOUNTER — Telehealth: Payer: Self-pay | Admitting: Nurse Practitioner

## 2013-10-13 NOTE — Telephone Encounter (Signed)
Blood cnt nml Kidney & liver func nml B12 borderline-recmd b complex daily with at least 2000 mcg b12. ESR low, not specific for vasculitis. Waiting on ANA. LM for pt.

## 2013-10-15 ENCOUNTER — Telehealth: Payer: Self-pay | Admitting: Nurse Practitioner

## 2013-10-15 NOTE — Telephone Encounter (Signed)
ANA neg.  Spoke w/pt: Symptoms have resolved with prednisone-"pins & needles" sensation in hands & feet is gone, red splotchy rash resolved per pt. Likely vasculitis as she has improved with prednisone.  Offered further work-up for vasculitis: anticardiolipin AB, Beta 2 glycoprotein, lupus anticoagulant-Pt wants to wait.  Recommend a non estrogen birth control to decrease risk of thrombus. Pt will consider & CB.

## 2013-11-07 ENCOUNTER — Encounter: Payer: Self-pay | Admitting: Nurse Practitioner

## 2013-11-10 ENCOUNTER — Encounter: Payer: Self-pay | Admitting: Nurse Practitioner

## 2013-12-13 ENCOUNTER — Other Ambulatory Visit: Payer: Self-pay | Admitting: Nurse Practitioner

## 2013-12-15 NOTE — Telephone Encounter (Signed)
AEX scheduled for 01/15/14//kn

## 2013-12-30 ENCOUNTER — Ambulatory Visit: Payer: 59 | Admitting: Nurse Practitioner

## 2014-01-15 ENCOUNTER — Ambulatory Visit: Payer: 59 | Admitting: Nurse Practitioner

## 2014-01-15 ENCOUNTER — Encounter: Payer: Self-pay | Admitting: Nurse Practitioner

## 2014-01-15 ENCOUNTER — Ambulatory Visit (INDEPENDENT_AMBULATORY_CARE_PROVIDER_SITE_OTHER): Payer: BC Managed Care – PPO | Admitting: Nurse Practitioner

## 2014-01-15 VITALS — BP 110/72 | HR 76 | Ht 62.25 in | Wt 97.0 lb

## 2014-01-15 DIAGNOSIS — Z309 Encounter for contraceptive management, unspecified: Secondary | ICD-10-CM

## 2014-01-15 DIAGNOSIS — Z0000 Encounter for general adult medical examination without abnormal findings: Secondary | ICD-10-CM

## 2014-01-15 DIAGNOSIS — Z01419 Encounter for gynecological examination (general) (routine) without abnormal findings: Secondary | ICD-10-CM

## 2014-01-15 LAB — HEMOGLOBIN, FINGERSTICK: HEMOGLOBIN, FINGERSTICK: 13.2 g/dL (ref 12.0–16.0)

## 2014-01-15 LAB — POCT URINALYSIS DIPSTICK
Bilirubin, UA: NEGATIVE
Blood, UA: NEGATIVE
Glucose, UA: NEGATIVE
KETONES UA: NEGATIVE
LEUKOCYTES UA: NEGATIVE
NITRITE UA: NEGATIVE
PROTEIN UA: NEGATIVE
Urobilinogen, UA: NEGATIVE
pH, UA: 5

## 2014-01-15 MED ORDER — NORETHIN ACE-ETH ESTRAD-FE 1-20 MG-MCG PO TABS: 1.0000 | ORAL_TABLET | Freq: Every day | ORAL | Status: DC

## 2014-01-15 NOTE — Patient Instructions (Signed)

## 2014-01-15 NOTE — Progress Notes (Signed)
Patient ID: Kaitlyn Sutton, female   DOB: 07-20-1986, 28 y.o.   MRN: [id number] 28 y.o. G0P0 Single Caucasian Fe here for annual exam. On OCP and has amenorrhea.  Has been on OCP for about 14 years.  Wants to consider coming off maybe this year.  Same partner for 1 year.   She works with the school system and raises money - she is now considering a job change to [place].  Her boyfriend lives there and they will be living together.  She leaves today to go for an interview tomorrow. This is the second interview for her.  Patient's last menstrual period was 11/08/2009.  Secondary to long term use of OCP        Sexually active: yes  The current method of family planning is OCP (estrogen/progesterone).    Exercising: yes  Home exercise routine includes walking about 2 miles everyday and hiking twice per month. 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  She has a 2.25 pack-year smoking history. She has never used smokeless tobacco. She reports that she drinks alcohol. She reports that she uses illicit drugs.  Past Medical History  Diagnosis Date  . ANXIETY   . DEPRESSION   . GERD   . VENEREAL WART   . 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 No past surgeries      Current Outpatient Prescriptions  Medication Sig Dispense Refill  . JUNEL FE 1/20 1-20 MG-MCG tablet TAKE 1 TABLET BY MOUTH ONCE DAILY  28 tablet  1  . LORazepam (ATIVAN) 0.5 MG tablet Take 1 to 2 T po q12h PRN anxiety.  30 tablet  1  . norethindrone-ethinyl estradiol (JUNEL FE,GILDESS FE,LOESTRIN FE) 1-20 MG-MCG tablet Take 1 tablet by mouth daily.  3 Package  3  . predniSONE (DELTASONE) 10 MG tablet Take 4T po qam X 3d, then 3T po qam X 1wk, then 2T po qam X 1wk, then 1T po qam X 1  wk, then 1/2 T po qam X 1 wk, then d/c.  58 tablet  0   No current facility-administered medications for this visit.    Family History  Problem Relation Age of Onset  . Alcohol abuse Mother   . Thyroid nodules Mother     thyrectomy  . Prostate cancer Maternal Uncle   . Seizures Maternal Uncle   . Cancer Maternal Uncle     prostate  . Heart attack Paternal Grandfather   . Heart disease Paternal Grandfather   . Alcohol abuse Other     grandparents  . Hypertension Other     grandparents  . Hyperlipidemia Other     grandparents  . Leukemia Paternal Grandmother   . Heart disease Maternal Grandfather   . Cancer Maternal Grandmother     cervical  . Meniere's disease Sister     hearing loss  . Seizures Maternal Uncle     ROS:  Pertinent items are noted in HPI.  Otherwise, a comprehensive ROS was negative.  Exam:   BP 110/72  Pulse 76  Ht 5' 2.25" (1.581 m)  Wt 97 lb (43.999 kg)  BMI 17.60 kg/m2  LMP 11/08/2009 Height: 5' 2.25" (158.1 cm)  Ht  Readings from Last 3 Encounters:  01/15/14 5' 2.25" (1.581 m)  10/10/13 5\' 2"  (1.575 m)  07/28/13 5\' 2"  (1.575 m)    General appearance: alert, cooperative and appears stated age Head: Normocephalic, without obvious abnormality, atraumatic Neck: no adenopathy, supple, symmetrical, trachea midline and thyroid normal to inspection and palpation Lungs: clear to auscultation bilaterally Breasts: normal appearance, no masses or tenderness Heart: regular rate and rhythm Abdomen: soft, non-tender; no masses,  no organomegaly Extremities: extremities normal, atraumatic, no cyanosis or edema Skin: Skin color, texture, turgor normal. No rashes or lesions Lymph nodes: Cervical, supraclavicular, and axillary nodes normal. No abnormal inguinal nodes palpated Neurologic: Grossly normal   Pelvic: External genitalia:  no lesions              Urethra:  normal appearing urethra with no masses, tenderness or lesions              Bartholin's and  Skene's: normal                 Vagina: normal appearing vagina with normal color and discharge, no lesions              Cervix: anteverted              Pap taken: yes Bimanual Exam:  Uterus:  normal size, contour, position, consistency, mobility, non-tender              Adnexa: no mass, fullness, tenderness               Rectovaginal: Confirms               Anus:  normal sphincter tone, no lesions  A:  Well Woman with normal exam  Contraception with OCP  Amenorrhea on OCP  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  Declines STD's - states this was done at PCP  Counseled on breast self exam, STD prevention, HIV risk factors and prevention, use and side effects of OCP's, adequate intake of calcium and vitamin D, diet and exercise return annually or prn  An After Visit Summary was printed and given to the patient.

## 2014-01-19 LAB — IPS PAP TEST WITH HPV

## 2014-01-19 NOTE — Progress Notes (Signed)
Encounter reviewed by Dr. Brook Silva.  

## 2014-07-03 ENCOUNTER — Telehealth: Payer: Self-pay | Admitting: Nurse Practitioner

## 2014-07-03 ENCOUNTER — Other Ambulatory Visit: Payer: Self-pay

## 2014-07-03 DIAGNOSIS — Z3040 Encounter for surveillance of contraceptives, unspecified: Secondary | ICD-10-CM

## 2014-07-03 MED ORDER — NORETHIN ACE-ETH ESTRAD-FE 1-20 MG-MCG PO TABS
1.0000 | ORAL_TABLET | Freq: Every day | ORAL | Status: AC
Start: 2014-07-03 — End: ?

## 2014-07-03 NOTE — Telephone Encounter (Signed)
Pt asking for refill for microgestron FE. Pt says that she has to start taking it tomorrow. Pt already tried to get rx transferred but they will not accept it. Please call pt when it is sent  Pharmacy [facility] [phone]

## 2014-07-03 NOTE — Telephone Encounter (Signed)
Please disregard

## 2014-07-03 NOTE — Telephone Encounter (Signed)
Rx called in to pharmacy per the pt Pt informed
